# Patient Record
Sex: Female | Born: 1955 | Race: Asian | Hispanic: No | Marital: Married | Smoking: Never smoker
Health system: Southern US, Community
[De-identification: ages and names within clinical notes are randomized; demographics above are authoritative.]

## PROBLEM LIST (undated history)

## (undated) DIAGNOSIS — D571 Sickle-cell disease without crisis: Secondary | ICD-10-CM

## (undated) DIAGNOSIS — E119 Type 2 diabetes mellitus without complications: Secondary | ICD-10-CM

## (undated) DIAGNOSIS — R011 Cardiac murmur, unspecified: Secondary | ICD-10-CM

## (undated) DIAGNOSIS — K449 Diaphragmatic hernia without obstruction or gangrene: Secondary | ICD-10-CM

## (undated) DIAGNOSIS — K219 Gastro-esophageal reflux disease without esophagitis: Secondary | ICD-10-CM

## (undated) DIAGNOSIS — I509 Heart failure, unspecified: Secondary | ICD-10-CM

## (undated) DIAGNOSIS — R1310 Dysphagia, unspecified: Secondary | ICD-10-CM

## (undated) DIAGNOSIS — I10 Essential (primary) hypertension: Secondary | ICD-10-CM

## (undated) HISTORY — DX: Dysphagia, unspecified: R13.10

## (undated) HISTORY — DX: Gastro-esophageal reflux disease without esophagitis: K21.9

## (undated) HISTORY — DX: Cardiac murmur, unspecified: R01.1

## (undated) HISTORY — DX: Diaphragmatic hernia without obstruction or gangrene: K44.9

## (undated) HISTORY — DX: Sickle-cell disease without crisis: D57.1

## (undated) HISTORY — DX: Heart failure, unspecified: I50.9

## (undated) HISTORY — DX: Essential (primary) hypertension: I10

---

## 2014-03-27 ENCOUNTER — Ambulatory Visit (INDEPENDENT_AMBULATORY_CARE_PROVIDER_SITE_OTHER): Payer: Self-pay | Admitting: Family Medicine

## 2014-03-27 VITALS — BP 140/90 | HR 64 | Temp 97.5°F | Resp 16 | Ht 62.0 in | Wt 145.0 lb

## 2014-03-27 DIAGNOSIS — I10 Essential (primary) hypertension: Secondary | ICD-10-CM

## 2014-03-27 MED ORDER — LISINOPRIL-HYDROCHLOROTHIAZIDE 10-12.5 MG PO TABS: 1.0000 | ORAL_TABLET | Freq: Every day | ORAL | Status: DC

## 2014-03-27 NOTE — Patient Instructions (Signed)
T?ng huy?t p (Hypertension) T?ng huy?t p, th??ng ???c g?i l huy?t p cao, l khi l?c b?m mu qua ??ng m?ch c?a qu v? qu m?nh. ??ng m?ch c?a qu v? l cc m?ch mu mang mu t? tim ?i kh?p c? th? c?a qu v?. K?t qu? ?o huy?t p c m?t con s? cao v m?t con s? th?p, ch?ng h?n 110/72. Con s? cao (tm thu) l p l?c bn trong ??ng m?ch khi tim qu v? b?m. Con s? th?p (tm tr??ng) l p l?c bn trong ??ng m?ch khi tim qu v? gin ra. Huy?t p l t??ng c?n cho qu v? ph?i l d??i 120/80. Ch?ng t?ng huy?t p bu?c tim qu v? ph?i lm vi?c v?t v? h?n ?? b?m mu. ??ng m?ch c?a qu v? c th? b? h?p ho?c c?ng. Ch?ng t?ng huy?t p lm qu v? c nguy c? b? b?nh tim, ??t qu? v cc v?n ?? khc.  CC Y?U T? NGUY C? M?t s? y?u t? nguy c? d?n ??n huy?t p cao c th? ki?m sot ???c. M?t s? y?u t? khc th khng.  Nh?ng y?u t? nguy c? khng th? ki?m sot ???c bao g?m:   Ch?ng t?c. Qu v? c nguy c? cao h?n n?u qu v? l ng??i M? g?c Phi.  ?? tu?i. Nguy c? t?ng ln theo ?? tu?i.  Gi?i tnh. Nam gi?i c nguy c? cao h?n ph? n? tr??c tu?i 45. Sau tu?i 65, ph? n? c nguy c? cao h?n nam gi?i. Nh?ng y?u t? nguy c? c th? ki?m sot ???c bao g?m:  Khng t?p th? d?c ho?c cc ho?t ??ng th? ch?t ??y ??.  Th?a cn.  ?n qu nhi?u ch?t bo, ???ng, ca-lo, ho?c mu?i.  U?ng qu nhi?u r??u. D?U HI?U V TRI?U CH?NG T?ng huy?t p th??ng khng gy ra d?u hi?u ho?c tri?u ch?ng. Huy?t p r?t cao (c?n cao huy?t p) c th? gy ?au ??u, lo l?ng, kh th?, v ch?y mu cam. CH?N ?ON  ?? ki?m tra xem qu v? c t?ng huy?t p khng, chuyn gia ch?m sc s?c kh?e c?a qu v? s? ?o huy?t p trong khi qu v? ng?i ??t tay ? m?c ngang v?i tim. Huy?t p c?n ???c ?o t nh?t hai l?n trn cng m?t cnh tay. M?t s? tnh tr?ng nh?t ??nh c th? lm cho huy?t p khc nhau gi?a tay ph?i v tay tri c?a qu v?. K?t qu? ?o huy?t p cao h?n bnh th??ng ? m?t th?i ?i?m no ? khng c ngh?a l qu v? c?n ?i?u tr?. N?u k?t qu? ?o huy?t p cao, hy h?i chuyn  gia ch?m sc s?c kh?e v? vi?c ki?m tra l?i huy?t p. ?I?U TR?  ?i?u tr? huy?t p cao gao g?m thay ??i l?i s?ng v c th? ph?i dng thu?c. C m?t l?i s?ng lnh m?nh c th? gip lm gi?m huy?t p cao. Qu v? c th? c?n thay ??i m?t s? thi quen. Thay ??i l?i s?ng c th? bao g?m:  Th?c hi?n ch? ?? ?n DASH. Ch? ?? ?n ny c nhi?u tri cy, rau, v ng? c?c nguyn h?t. C t mu?i, th?t ??, v t b? sung ???ng.  Hy dnh 2 1/2 ti?ng ho?t ??ng thn th? nhanh m?i tu?n.  Gi?m cn n?u c?n thi?t.  Khng ht thu?c.  H?n ch? ?? u?ng c c?n.  H?c cc cch gi?m c?ng th?ng. N?u thay ??i l?i s?ng khng ?? ?? ??a huy?t p v? m?c c th? ki?m sot ???  c, chuyn gia ch?m sc s?c kh?e c th? k ??n thu?c. Qu v? c th? c?n dng nhi?u lo?i thu?c. Ph?i h?p ch?t ch? v?i chuyn gia ch?m sc s?c kh?e ?? tm hi?u cc nguy c? v l?i ch. H??NG D?N CH?M SC T?I NH  Ki?m tra l?i huy?t p c?a qu v? theo ch? d?n c?a chuyn gia ch?m sc s?c kh?e.  Ch? s? d?ng thu?c theo ch? d?n c?a chuyn gia ch?m sc s?c kh?e. Lm theo ch? d?n m?t cch c?n th?n. Thu?c ?i?u tr? huy?t p ph?i ???c dng theo ??n ? k. Thu?c c?ng s? khng c tc d?ng khi qu v? b? li?u. Vi?c b? li?u thu?c c?ng lm qu v? c nguy c? pht sinh v?n ??.  Khng ht thu?c.  Theo di huy?t p c?a qu v? ? nh theo ch? d?n c?a chuyn gia ch?m sc s?c kh?e. ?I KHM N?U:   Qu v? ngh? qu v? c ph?n ?ng v?i thu?c ?ang dng.  Qu v? b? ?au ??u ho?c c?m th?y chng m?t ti di?n.  Qu v? b? s?ng ph ? m?t c chn.  Qu v? c v?n ?? v? th? l?c. NGAY L?P T?C ?I KHM N?U:  Qu v? b? ?au ??u n?ng ho?c l l?n.  Qu v? b? y?u b?t th??ng, t b, ho?c c?m th?y nh? ng?t x?u.  Qu v? b? ?au ng?c ho?c ?au b?ng r?t nhi?u.  Qu v? nn nhi?u l?n.  Qu v? b? kh th?. ??M B?O QU V?:   Hi?u r cc h??ng d?n ny.  S? theo di tnh tr?ng c?a mnh.  S? yu c?u tr? gip ngay l?p t?c n?u qu v? c?m th?y khng kh?e ho?c th?y tr?m tr?ng h?n. Document Released:  07/31/2005 Document Revised: 12/15/2013 ExitCare Patient Information 2015 [employer]. This information is not intended to replace advice given to you by your health care provider. Make sure you discuss any questions you have with your health care provider.  

## 2014-03-27 NOTE — Progress Notes (Signed)
Chief Complaint:  Chief Complaint  Patient presents with  . Hypertension    HPI: Madeline Bell is a 58 y.o. female who is here for  HTN check up Was at the dentist 196/103 on Wednesday, on Thursday 153/100 , then this morning 176/90 She was on BP meds twice daily but not sure the name, she was given it in [place] and only 1 month.  She never return. No alleries with blood pressure medicines. The blood pressure made her drowsy, HA and then it went away Denies any CP, SOB, n/v/abd pain, palpitations She had a check up 1 year ago and then took   Past Medical History  Diagnosis Date  . Hypertension    History reviewed. No pertinent past surgical history. History   Social History  . Marital Status: Married    Spouse Name: N/A    Number of Children: N/A  . Years of Education: N/A   Social History Main Topics  . Smoking status: Never Smoker   . Smokeless tobacco: None  . Alcohol Use: None  . Drug Use: None  . Sexual Activity: None   Other Topics Concern  . None   Social History Narrative  . None   Family History  Problem Relation Age of Onset  . Hypertension Father    Allergies  Allergen Reactions  . Bactrim [Sulfamethoxazole-Tmp Ds]     Rash   . Cephalexin   . Doxycycline     Rash and itching and lips feel burnt, no anaphylaxis  . Penicillins   . Tetracyclines & Related Rash   Prior to Admission medications   Medication Sig Start Date End Date Taking? Authorizing Provider  ibuprofen (ADVIL,MOTRIN) 200 MG tablet Take 200 mg by mouth every 6 (six) hours as needed.   Yes Historical Provider, MD     ROS: The patient denies fevers, chills, night sweats, unintentional weight loss, chest pain, palpitations, wheezing, dyspnea on exertion, nausea, vomiting, abdominal pain, dysuria, hematuria, melena, numbness, weakness, or tingling.   All other systems have been reviewed and were otherwise negative with the exception of those mentioned in the HPI and as above.     PHYSICAL EXAM: Filed Vitals:   03/27/14 1311  BP: 140/90  Pulse: 64  Temp: 97.5 F (36.4 C)  Resp: 16   Filed Vitals:   03/27/14 1311  Height: 5\' 2"  (1.575 m)  Weight: 145 lb (65.772 kg)   Body mass index is 26.51 kg/(m^2).  General: Alert, no acute distress HEENT:  Normocephalic, atraumatic, oropharynx patent. EOMI, PERRLA, fundo exam normal Cardiovascular:  Regular rate and rhythm, no rubs murmurs or gallops.  No Carotid bruits, radial pulse intact. No pedal edema.  Respiratory: Clear to auscultation bilaterally.  No wheezes, rales, or rhonchi.  No cyanosis, no use of accessory musculature GI: No organomegaly, abdomen is soft and non-tender, positive bowel sounds.  No masses. Skin: No rashes. Neurologic: Facial musculature symmetric. CN 2-12 grossly nl Psychiatric: Patient is appropriate throughout our interaction. Lymphatic: No cervical lymphadenopathy Musculoskeletal: Gait intact. 5/5 strength   LABS: No results found for this or any previous visit.   EKG/XRAY:   Primary read interpreted by Dr. Conley Rolls at [facility].   ASSESSMENT/PLAN: Encounter Diagnosis  Name Primary?  . Essential hypertension Yes   Monitor BP, BP goal is < 150/90 Rx lisnopril hctz F/u in 2-4 weeks at appt office with BP logs, advise to get BP log Advise to follow dash diet, cut down on soy sauce and  salt  Gross sideeffects, risk and benefits, and alternatives of medications d/w patient. Patient is aware that all medications have potential sideeffects and we are unable to predict every sideeffect or drug-drug interaction that may occur.  [name], [name], DO 03/27/2014 3:26 PM

## 2014-03-28 LAB — COMPLETE METABOLIC PANEL WITHOUT GFR
ALT: 15 U/L (ref 0–35)
AST: 20 U/L (ref 0–37)
BUN: 9 mg/dL (ref 6–23)
Creat: 0.53 mg/dL (ref 0.50–1.10)
GFR, Est African American: >89 mL/min
Total Bilirubin: 0.7 mg/dL (ref 0.2–1.2)

## 2014-03-28 LAB — COMPLETE METABOLIC PANEL WITH GFR
Albumin: 5 g/dL (ref 3.5–5.2)
Alkaline Phosphatase: 74 U/L (ref 39–117)
CO2: 22 mEq/L (ref 19–32)
Calcium: 9.7 mg/dL (ref 8.4–10.5)
Chloride: 101 mEq/L (ref 96–112)
GFR, Est Non African American: >89 mL/min
Glucose, Bld: 74 mg/dL (ref 70–99)
Potassium: 5 mEq/L (ref 3.5–5.3)
Sodium: 139 mEq/L (ref 135–145)
Total Protein: 8.6 g/dL — ABNORMAL HIGH (ref 6.0–8.3)

## 2014-04-09 ENCOUNTER — Encounter: Payer: Self-pay | Admitting: Family Medicine

## 2014-05-02 ENCOUNTER — Ambulatory Visit (INDEPENDENT_AMBULATORY_CARE_PROVIDER_SITE_OTHER): Payer: Self-pay | Admitting: Family Medicine

## 2014-05-02 VITALS — BP 130/78 | HR 68 | Temp 97.9°F | Resp 16 | Ht 61.0 in | Wt 138.0 lb

## 2014-05-02 DIAGNOSIS — L299 Pruritus, unspecified: Secondary | ICD-10-CM

## 2014-05-02 DIAGNOSIS — L270 Generalized skin eruption due to drugs and medicaments taken internally: Secondary | ICD-10-CM

## 2014-05-02 DIAGNOSIS — T50905A Adverse effect of unspecified drugs, medicaments and biological substances, initial encounter: Secondary | ICD-10-CM

## 2014-05-02 DIAGNOSIS — L298 Other pruritus: Secondary | ICD-10-CM

## 2014-05-02 DIAGNOSIS — I10 Essential (primary) hypertension: Secondary | ICD-10-CM

## 2014-05-02 MED ORDER — METHYLPREDNISOLONE (PAK) 4 MG PO TABS: ORAL_TABLET | ORAL | Status: DC

## 2014-05-02 MED ORDER — METHYLPREDNISOLONE ACETATE 80 MG/ML IJ SUSP
120.0000 mg | Freq: Once | INTRAMUSCULAR | Status: AC
  Administered 2014-05-02: 120 mg via INTRAMUSCULAR

## 2014-05-02 MED ORDER — AMLODIPINE BESYLATE 5 MG PO TABS: 5.0000 mg | ORAL_TABLET | Freq: Every day | ORAL | Status: DC

## 2014-05-02 NOTE — Progress Notes (Signed)
Subjective: 58 year old [place] lady who does not speak much [place]. Her sons came in to interpret for her today. She was here last month and saw Dr. Conley Rolls. She was treated with a blood pressure medicine. A week ago she started breaking out with a pruritic rash. It got worse after she resumed taking the medicine. She itches terribly. She had some itching her face and lips. Chest wall and arms. Some minor ankles and upper back.  Objective: Has a confluent red rash on her arms and down onto her hands. The hands are more maculopapular. She has a little rash on her face and upper back and chest wall and ankles. Chest was clear. Heart regular. She did start coughing a little cause in the room. That mammogram not [place] related to the allergy.  Assessment: Probable hydrochlorothiazide allergic reaction from the lisinopril HCT, though it could Allaina lisinopril also History of multiple medication allergies including sulfa  Hypertension   Plan: Depo-Medrol 120 Prednisone Ranitidine Cautioned to go to emergency room if worse Zyrtec  Will go to amlodipine. She has used it in the past.

## 2014-05-02 NOTE — Patient Instructions (Addendum)
Take over-the-counter Zyrtec (cetirizine) 1 daily for itching and allergy  Take over-the-counter ranitidine 150 mg twice daily for rash. This is a stomach medicine, but it also helps the rash.  Take the Medrol Dosepak in the fashion directed on the pack  Return if worse or not improving  Begin taking amlodipine 5 mg one daily  Return in 3 months to get your blood pressure rechecked

## 2014-06-20 ENCOUNTER — Other Ambulatory Visit: Payer: Self-pay | Admitting: Family Medicine

## 2016-02-17 ENCOUNTER — Ambulatory Visit (INDEPENDENT_AMBULATORY_CARE_PROVIDER_SITE_OTHER): Payer: Self-pay

## 2016-02-17 ENCOUNTER — Ambulatory Visit (INDEPENDENT_AMBULATORY_CARE_PROVIDER_SITE_OTHER): Payer: Self-pay | Admitting: Physician Assistant

## 2016-02-17 VITALS — BP 140/92 | HR 94 | Temp 97.9°F | Resp 16 | Ht 61.0 in | Wt 155.0 lb

## 2016-02-17 DIAGNOSIS — R229 Localized swelling, mass and lump, unspecified: Secondary | ICD-10-CM

## 2016-02-17 DIAGNOSIS — M79671 Pain in right foot: Secondary | ICD-10-CM

## 2016-02-17 LAB — POCT CBC
GRANULOCYTE PERCENT: 59 % (ref 37–80)
HCT, POC: 40.2 % (ref 37.7–47.9)
Hemoglobin: 13.6 g/dL (ref 12.2–16.2)
LYMPH, POC: 4.3 — AB (ref 0.6–3.4)
MCH, POC: 26.6 pg — AB (ref 27–31.2)
MCHC: 33.8 g/dL (ref 31.8–35.4)
MCV: 78.6 fL — AB (ref 80–97)
MID (CBC): 0.4 (ref 0–0.9)
MPV: 7.3 fL (ref 0–99.8)
PLATELET COUNT, POC: 225 10*3/uL (ref 142–424)
POC Granulocyte: 6.7 (ref 2–6.9)
POC LYMPH PERCENT: 37.5 %L (ref 10–50)
POC MID %: 3.5 %M (ref 0–12)
RBC: 5.12 M/uL (ref 4.04–5.48)
RDW, POC: 13.3 %
WBC: 11.4 10*3/uL — AB (ref 4.6–10.2)

## 2016-02-17 MED ORDER — MELOXICAM 7.5 MG PO TABS: 7.5000 mg | ORAL_TABLET | Freq: Every day | ORAL | Status: DC

## 2016-02-17 NOTE — Progress Notes (Signed)
[facility] [address], [place] [id number] [phone]  Date:  02/17/2016   Name:  Madeline Bell   DOB:  03/16/1956   MRN:  [id number]  PCP:  No PCP Per Patient    History of Present Illness:  Madeline Bell is a 60 y.o. female patient who [facility] cc of left heel pain.   3 weeks pain with right foot.  She has swelling around the ankle.  Heel pain radiates up her heel.  She has never injureed the foot.  No redness.  She generally wear soft shoes.  Nothing makes it feel better.  Stays home without working.  Does not recall any extreme long walks.  She has not had any broken materials in the home that she can recall, that she has walked on.  No trauma.     Patient Active Problem List   Diagnosis Date Noted  . HTN (hypertension), benign 05/02/2014    Past Medical History  Diagnosis Date  . Hypertension     History reviewed. No pertinent past surgical history.  Social History  Substance Use Topics  . Smoking status: Never Smoker   . Smokeless tobacco: None  . Alcohol Use: None    Family History  Problem Relation Age of Onset  . Hypertension Father     Allergies  Allergen Reactions  . Lisinopril-Hydrochlorothiazide   . Bactrim [Sulfamethoxazole-Trimethoprim]     Rash   . Cephalexin   . Doxycycline     Rash and itching and lips feel burnt, no anaphylaxis  . Gentamicin Sulfate   . Penicillins   . Tetracyclines & Related Rash    Medication list has been reviewed and updated.  Current Outpatient Prescriptions on File Prior to Visit  Medication Sig Dispense Refill  . amLODipine (NORVASC) 5 MG tablet Take 1 tablet (5 mg total) by mouth daily. (Patient not taking: Reported on 02/17/2016) 90 tablet 1  . ibuprofen (ADVIL,MOTRIN) 200 MG tablet Take 200 mg by mouth every 6 (six) hours as needed. Reported on 02/17/2016     No current facility-administered medications on file prior to visit.    ROS ROS otherwise unremarkable unless listed above.     Physical Examination: BP 174/97 mmHg  Pulse 94  Temp(Src) 97.9 F (36.6 C) (Oral)  Resp 16  Ht 5\' 1"  (1.549 m)  Wt 155 lb (70.308 kg)  BMI 29.30 kg/m2  SpO2 97% Ideal Body Weight: Weight in (lb) to have BMI = 25: 132  Physical Exam  Constitutional: She is oriented to person, place, and time. She appears well-developed and well-nourished. No distress.  HENT:  Head: Normocephalic and atraumatic.  Right Ear: External ear normal.  Left Ear: External ear normal.  Eyes: Conjunctivae and EOM are normal. Pupils are equal, round, and reactive to light.  Cardiovascular: Normal rate.   Pulmonary/Chest: Effort normal. No respiratory distress.  Musculoskeletal:  Right foot with pain at the heel upon palpation.  No erythema or obvious swelling, or warmth detected.  No foreign body or callused location detected.  Normal range of motion.  Negative thompson.  Slight antalgic gait.       Neurological: She is alert and oriented to person, place, and time.  Skin: She is not diaphoretic.  Psychiatric: She has a normal mood and affect. Her behavior is normal.    Dg Os Calcis Right  02/17/2016  CLINICAL DATA:  Left heel pain.  Swelling.  No known injury. EXAM: RIGHT OS CALCIS -  2+ VIEW COMPARISON:  No prior. FINDINGS: Diffuse soft tissue swelling. No radiopaque foreign body. No focal or acute bony abnormality . IMPRESSION: Diffuse soft tissue swelling. No radiopaque foreign body. No acute bony abnormality. Electronically Signed   By: Maisie Fushomas  Register   On: 02/17/2016 09:20    Assessment and Plan: Madeline Bell is a 60 y.o. female who is here today for right heel pain. Language barrier while she is with daughter who is very proficient.   Likely inflammation possible poor footwear as she arrives here in flip flops.   Advised ice and anti-inflammatory use.  Also advised sneaker footwear.  rtc in 10 days for followup.  Advised alarming sxs to warrant immediate return. Heel pain, right - Plan: DG Os Calcis  Right, POCT CBC, meloxicam (MOBIC) 7.5 MG tablet  Soft tissue swelling - Plan: meloxicam (MOBIC) 7.5 MG tablet  Trena PlattStephanie Ashira Kirsten, PA-C [facility] 02/17/2016 8:47 AM

## 2016-02-17 NOTE — Patient Instructions (Addendum)
     IF you received an x-ray today, you will receive an invoice from [facility]. Please contact [facility] at [phone] with questions or concerns regarding your invoice.   IF you received labwork today, you will receive an invoice from [facility]. Please contact [facility] at [phone] with questions or concerns regarding your invoice.   Our billing staff will not Vi able to assist you with questions regarding bills from these companies.  You will Antha contacted with the lab results as soon as they are available. The fastest way to get your results is to activate your My Chart account. Instructions are located on the last page of this paperwork. If you have not heard from us regarding the results in 2 weeks, please contact this office.    Ice the heel three times per day for 15 minutes.  This could Shacoya submerged in ice tub I would like you to also wear heel cups in shoes.  These can Jolisa picked up at the pharmacy or [employer].  You can ask the pharmacy to point you to this. Please take the mobic as prescribed.  Do not take naproxen or ibuprofen with this.  You can take tylenol.

## 2016-03-15 ENCOUNTER — Other Ambulatory Visit: Payer: Self-pay | Admitting: Physician Assistant

## 2016-03-15 DIAGNOSIS — M79671 Pain in right foot: Secondary | ICD-10-CM

## 2016-03-15 DIAGNOSIS — R229 Localized swelling, mass and lump, unspecified: Secondary | ICD-10-CM

## 2016-07-21 ENCOUNTER — Ambulatory Visit (INDEPENDENT_AMBULATORY_CARE_PROVIDER_SITE_OTHER): Payer: Self-pay | Admitting: Family Medicine

## 2016-07-21 VITALS — BP 124/78 | HR 84 | Temp 98.4°F | Resp 16 | Ht 62.5 in | Wt 153.0 lb

## 2016-07-21 DIAGNOSIS — J069 Acute upper respiratory infection, unspecified: Secondary | ICD-10-CM

## 2016-07-21 DIAGNOSIS — R630 Anorexia: Secondary | ICD-10-CM

## 2016-07-21 DIAGNOSIS — R1013 Epigastric pain: Secondary | ICD-10-CM

## 2016-07-21 LAB — POCT URINALYSIS DIP (MANUAL ENTRY)
GLUCOSE UA: NEGATIVE
Ketones, POC UA: NEGATIVE
NITRITE UA: NEGATIVE
Protein Ur, POC: 30 — AB
RBC UA: NEGATIVE
Spec Grav, UA: 1.02
UROBILINOGEN UA: 0.2
pH, UA: 5.5

## 2016-07-21 MED ORDER — OMEPRAZOLE 20 MG PO CPDR: 20.0000 mg | DELAYED_RELEASE_CAPSULE | Freq: Every day | ORAL | 3 refills | Status: DC

## 2016-07-21 MED ORDER — BENZONATATE 100 MG PO CAPS: 100.0000 mg | ORAL_CAPSULE | Freq: Two times a day (BID) | ORAL | 0 refills | Status: DC | PRN

## 2016-07-21 MED ORDER — FAMOTIDINE 20 MG PO TABS
20.0000 mg | ORAL_TABLET | Freq: Two times a day (BID) | ORAL | 1 refills | Status: DC
Start: 2016-07-21 — End: 2018-02-23

## 2016-07-21 MED ORDER — CETIRIZINE HCL 10 MG PO TABS
10.0000 mg | ORAL_TABLET | Freq: Every day | ORAL | 11 refills | Status: DC
Start: 2016-07-21 — End: 2018-02-23

## 2016-07-21 MED ORDER — LORATADINE 10 MG PO TABS: 10.0000 mg | ORAL_TABLET | Freq: Every day | ORAL | 3 refills | Status: DC

## 2016-07-21 NOTE — Progress Notes (Signed)
Chief Complaint  Patient presents with  . Cough    last few days  . Chills  . Sore Throat    HPI  Pt is here with her daughter who is translating Djin Ellenbecker  She reports that she has bitter saliva and a cough that is causing abdominal pain. Symptoms started 4 days ago and is getting better She also has chills but no fevers She reports that when she coughs her abdomen feels tight and painful She denies diarrhea but reports nausea with eating She has not been eating well with poor appetite She reports that there is not sick contacts   Past Medical History:  Diagnosis Date  . Hypertension     Current Outpatient Prescriptions  Medication Sig Dispense Refill  . amLODipine (NORVASC) 5 MG tablet Take 1 tablet (5 mg total) by mouth daily. (Patient not taking: Reported on 07/21/2016) 90 tablet 1  . ibuprofen (ADVIL,MOTRIN) 200 MG tablet Take 200 mg by mouth every 6 (six) hours as needed. Reported on 02/17/2016    . loratadine (CLARITIN) 10 MG tablet Take 1 tablet (10 mg total) by mouth daily. 30 tablet 3  . meloxicam (MOBIC) 7.5 MG tablet Take 1 tablet (7.5 mg total) by mouth daily. (Patient not taking: Reported on 07/21/2016) 30 tablet 0  . omeprazole (PRILOSEC) 20 MG capsule Take 1 capsule (20 mg total) by mouth daily. 30 capsule 3   No current facility-administered medications for this visit.     Allergies:  Allergies  Allergen Reactions  . Lisinopril-Hydrochlorothiazide   . Bactrim [Sulfamethoxazole-Trimethoprim]     Rash   . Cephalexin   . Doxycycline     Rash and itching and lips feel burnt, no anaphylaxis  . Gentamicin Sulfate   . Penicillins   . Tetracyclines & Related Rash    No past surgical history on file.  Social History   Social History  . Marital status: Married    Spouse name: N/A  . Number of children: N/A  . Years of education: N/A   Social History Main Topics  . Smoking status: Never Smoker  . Smokeless tobacco: None  . Alcohol use None  . Drug  use: Unknown  . Sexual activity: Not Asked   Other Topics Concern  . None   Social History Narrative  . None    Review of Systems  Constitutional: Positive for chills and malaise/fatigue. Negative for fever.  Respiratory: Positive for cough. Negative for hemoptysis, sputum production, shortness of breath and wheezing.   Cardiovascular: Negative for chest pain and palpitations.  Gastrointestinal: Positive for abdominal pain and nausea. Negative for diarrhea, heartburn and vomiting.  Genitourinary: Negative for dysuria, frequency and urgency.  Skin: Negative for itching and rash.    Objective: Vitals:   07/21/16 0952  BP: 124/78  Pulse: 84  Resp: 16  Temp: 98.4 F (36.9 C)  SpO2: 95%  Weight: 153 lb (69.4 kg)  Height: 5' 2.5" (1.588 m)    Physical Exam  General: alert, oriented, in NAD Head: normocephalic, atraumatic, no sinus tenderness Eyes: EOM intact, no scleral icterus or conjunctival injection Ears: TM clear bilaterally Throat: no pharyngeal exudate or erythema Lymph: no posterior auricular, submental or cervical lymph adenopathy Heart: normal rate, normal sinus rhythm, no murmurs Lungs: clear to auscultation bilaterally, no wheezing Abdomen: nondistended, normoactive bs, soft, epigastric tenderness   POCT urinalysis dipstick  Order: [id number]  Status:  Final result Visible to patient:  No (Not Released) Next appt:  None Dx:  Epigastric  pain; Poor appetite   Ref Range & Units 10:29  Color, UA yellow yellow   Clarity, UA clear clear   Glucose, UA negative negative   Bilirubin, UA negative small    Ketones, POC UA negative negative   Spec Grav, UA  1.020   Blood, UA negative negative   pH, UA  5.5   Protein Ur, POC negative =30    Urobilinogen, UA  0.2   Nitrite, UA Negative Negative   Leukocytes, UA Negative Trace          Assessment and Plan Myna was seen today for cough, chills and sore throat.  Diagnoses and all orders for this  visit:  Epigastric pain- discussed reflux  She will Jaylei given pepcid bid to allow the irritation of the lining to heal -     POCT urinalysis dipstick  Poor appetite- advised pt to to increase intake of fluids and food Gave medication to minimize mucus drainage (zyrtec) Increase hydration to keep urine clear If urine output does not increase then return to clinic for reevaluation -     POCT urinalysis dipstick  Acute URI- tessalon, claritin, and supportive care  Other orders -     omeprazole (PRILOSEC) 20 MG capsule; Take 1 capsule (20 mg total) by mouth daily. -     loratadine (CLARITIN) 10 MG tablet; Take 1 tablet (10 mg total) by mouth daily.     Alishia Lebo A Lashai Grosch

## 2016-07-21 NOTE — Patient Instructions (Addendum)
IF you received an x-ray today, you will receive an invoice from [facility]. Please contact [facility] at [phone] with questions or concerns regarding your invoice.   IF you received labwork today, you will receive an invoice from [facility]. Please contact [facility] at [phone] with questions or concerns regarding your invoice.   Our billing staff will not Lesleyanne able to assist you with questions regarding bills from these companies.  You will Ryelle contacted with the lab results as soon as they are available. The fastest way to get your results is to activate your My Chart account. Instructions are located on the last page of this paperwork. If you have not heard from us regarding the results in 2 weeks, please contact this office.      Heartburn Heartburn is a type of pain or discomfort that can happen in the throat or chest. It is often described as a burning pain. It may also cause a bad taste in the mouth. Heartburn may feel worse when you lie down or bend over, and it is often worse at night. Heartburn may Ajai caused by stomach contents that move back up into the esophagus (reflux). Follow these instructions at home: Take these actions to decrease your discomfort and to help avoid complications. Diet  Follow a diet as recommended by your health care provider. This may involve avoiding foods and drinks such as:  Coffee and tea (with or without caffeine).  Drinks that contain alcohol.  Energy drinks and sports drinks.  Carbonated drinks or sodas.  Chocolate and cocoa.  Peppermint and mint flavorings.  Garlic and onions.  Horseradish.  Spicy and acidic foods, including peppers, chili powder, curry powder, vinegar, hot sauces, and barbecue sauce.  Citrus fruit juices and citrus fruits, such as oranges, lemons, and limes.  Tomato-based foods, such as red sauce, chili, salsa, and pizza with red sauce.  Fried and fatty  foods, such as donuts, french fries, potato chips, and high-fat dressings.  High-fat meats, such as hot dogs and fatty cuts of red and white meats, such as rib eye steak, sausage, ham, and bacon.  High-fat dairy items, such as whole milk, butter, and cream cheese.  Eat small, frequent meals instead of large meals.  Avoid drinking large amounts of liquid with your meals.  Avoid eating meals during the 2-3 hours before bedtime.  Avoid lying down right after you eat.  Do not exercise right after you eat. General instructions  Pay attention to any changes in your symptoms.  Take over-the-counter and prescription medicines only as told by your health care provider. Do not take aspirin, ibuprofen, or other NSAIDs unless your health care provider told you to do so.  Do not use any tobacco products, including cigarettes, chewing tobacco, and e-cigarettes. If you need help quitting, ask your health care provider.  Wear loose-fitting clothing. Do not wear anything tight around your waist that causes pressure on your abdomen.  Raise (elevate) the head of your bed about 6 inches (15 cm).  Try to reduce your stress, such as with yoga or meditation. If you need help reducing stress, ask your health care provider.  If you are overweight, reduce your weight to an amount that is healthy for you. Ask your health care provider for guidance about a safe weight loss goal.  Keep all follow-up visits as told by your health care provider. This is important. Contact a health care provider if:  You have new symptoms.  You have unexplained weight loss.  You have difficulty swallowing, or it hurts to swallow.  You have wheezing or a persistent cough.  Your symptoms do not improve with treatment.  You have frequent heartburn for more than two weeks. Get help right away if:  You have pain in your arms, neck, jaw, teeth, or back.  You feel sweaty, dizzy, or light-headed.  You have chest pain or  shortness of breath.  You vomit and your vomit looks like blood or coffee grounds.  Your stool is bloody or black. This information is not intended to replace advice given to you by your health care provider. Make sure you discuss any questions you have with your health care provider. Document Released: 12/17/2008 Document Revised: 01/06/2016 Document Reviewed: 11/25/2014 Elsevier Interactive Patient Education  2017 [employer]

## 2017-10-19 ENCOUNTER — Emergency Department (HOSPITAL_COMMUNITY): Payer: Self-pay

## 2017-10-19 ENCOUNTER — Encounter (HOSPITAL_COMMUNITY): Payer: Self-pay | Admitting: Emergency Medicine

## 2017-10-19 ENCOUNTER — Emergency Department (HOSPITAL_COMMUNITY)
Admission: EM | Admit: 2017-10-19 | Discharge: 2017-10-19 | Disposition: A | Discharge status: Home / Self Care | Payer: Self-pay | Attending: Emergency Medicine | Admitting: Emergency Medicine

## 2017-10-19 DIAGNOSIS — R519 Headache, unspecified: Secondary | ICD-10-CM

## 2017-10-19 DIAGNOSIS — R51 Headache: Secondary | ICD-10-CM | POA: Insufficient documentation

## 2017-10-19 DIAGNOSIS — Z79899 Other long term (current) drug therapy: Secondary | ICD-10-CM | POA: Insufficient documentation

## 2017-10-19 LAB — I-STAT TROPONIN, ED: Troponin i, poc: 0 ng/mL (ref 0.00–0.08)

## 2017-10-19 LAB — BASIC METABOLIC PANEL
ANION GAP: 14 (ref 5–15)
BUN: 12 mg/dL (ref 6–20)
CHLORIDE: 103 mmol/L (ref 101–111)
CO2: 22 mmol/L (ref 22–32)
Calcium: 9.5 mg/dL (ref 8.9–10.3)
Creatinine, Ser: 0.73 mg/dL (ref 0.44–1.00)
GFR calc Af Amer: >60 mL/min (ref >60)
GLUCOSE: 169 mg/dL — AB (ref 65–99)
POTASSIUM: 4.2 mmol/L (ref 3.5–5.1)
Sodium: 139 mmol/L (ref 135–145)

## 2017-10-19 LAB — CBC
HEMATOCRIT: 40.8 % (ref 36.0–46.0)
HEMOGLOBIN: 13.3 g/dL (ref 12.0–15.0)
MCH: 26.4 pg (ref 26.0–34.0)
MCHC: 32.6 g/dL (ref 30.0–36.0)
MCV: 81 fL (ref 78.0–100.0)
Platelets: 338 10*3/uL (ref 150–400)
RBC: 5.04 MIL/uL (ref 3.87–5.11)
RDW: 13.1 % (ref 11.5–15.5)
WBC: 10.6 10*3/uL — ABNORMAL HIGH (ref 4.0–10.5)

## 2017-10-19 MED ORDER — BUTALBITAL-APAP-CAFFEINE 50-325-40 MG PO TABS: 1.0000 | ORAL_TABLET | Freq: Four times a day (QID) | ORAL | 0 refills | Status: DC | PRN

## 2017-10-19 NOTE — ED Triage Notes (Signed)
Pt to ER sent from PCP for evaluation of hypertension and abnormal EKG. Was seen at clinic today for left facial and neck pain. BP noted to Taleeyah 202/100, was given clonidine. BP at this time 140/82. A/o x4. NAD

## 2017-10-19 NOTE — ED Notes (Addendum)
Pt present from her doctor's office with a complaint of left sided head pain behind her eyes and left ear pain that radiates between both sites, HTN, and an abnormal EKG. Pt denies any chest pains. Pt does not speak english but her daughter can translate.

## 2017-10-19 NOTE — Discharge Instructions (Signed)
You have been evaluated for your headache today.  Your head CT scan is normal.  Your blood pressure have improved.  Follow up with your doctor for further evaluation and management of your high blood pressure.  Return if you have any concerns.

## 2017-10-19 NOTE — ED Provider Notes (Signed)
[facility] EMERGENCY DEPARTMENT Provider Note   CSN: [id number] Arrival date & time: 10/19/17  1546     History   Chief Complaint Chief Complaint  Patient presents with  . Hypertension  . Abnormal ECG    HPI Madeline Bell is a 62 y.o. female.  HPI   62 year old female with history of hypertension, non-smoker sent here from PCP office for evaluation of high blood pressure and abnormal EKG.  History obtained through daughter who is at bedside.  Patient report for the past 3 days she has had intermittent sharp shooting pain primarily involving the left side of her face radiates to her left ear.  Pain is usually lasting for several minutes.  The pain is minimal at this time.  She did endorse one bouts of slight blurry vision but that has since resolved.  She was seen by her PCP for the first time today for her complaint.  It was noted that her blood pressure was high at 202/105.  Patient was given clonidine.  EKG obtained shown some abnormalities and PCP sent patient here for further evaluation.  Patient mentioned that her headache is minimal at this time and did improve with blood pressure medication.  She denies any associated fever, chills, URI symptoms, diplopia, confusion, neck pain, chest pain, trouble breathing, focal numbness or weakness, abdominal pain or back pain.  She was diagnosed with high blood pressure in the past, was placed on lisinopril but developed a reaction to it therefore she had not been taking any blood pressure medication for a while.  Daughter also mentioned that patient loves to eat salty food.  Her daughter also concerned of a pulsating area on the right side of her neck which has been ongoing for years and not new.  Patient is not a smoker or drinker, no prior cardiac history aside from hypertension, no history of stroke.     Past Medical History:  Diagnosis Date  . Hypertension     Patient Active Problem List   Diagnosis Date Noted  . HTN  (hypertension), benign 05/02/2014    History reviewed. No pertinent surgical history.  OB History    No data available       Home Medications    Prior to Admission medications   Medication Sig Start Date End Date Taking? Authorizing Provider  amLODipine (NORVASC) 5 MG tablet Take 1 tablet (5 mg total) by mouth daily. Patient not taking: Reported on 07/21/2016 05/02/14   Peyton Najjar, MD  benzonatate (TESSALON) 100 MG capsule Take 1 capsule (100 mg total) by mouth 2 (two) times daily as needed for cough. 07/21/16   Doristine Bosworth, MD  cetirizine (ZYRTEC) 10 MG tablet Take 1 tablet (10 mg total) by mouth daily. 07/21/16   Doristine Bosworth, MD  famotidine (PEPCID) 20 MG tablet Take 1 tablet (20 mg total) by mouth 2 (two) times daily. 07/21/16   Doristine Bosworth, MD  ibuprofen (ADVIL,MOTRIN) 200 MG tablet Take 200 mg by mouth every 6 (six) hours as needed. Reported on 02/17/2016    [provider]  loratadine (CLARITIN) 10 MG tablet Take 1 tablet (10 mg total) by mouth daily. 07/21/16   Doristine Bosworth, MD  meloxicam (MOBIC) 7.5 MG tablet Take 1 tablet (7.5 mg total) by mouth daily. Patient not taking: Reported on 07/21/2016 02/17/16   Trena Platt D, PA  omeprazole (PRILOSEC) 20 MG capsule Take 1 capsule (20 mg total) by mouth daily. 07/21/16   Creta Levin,  Manus RuddZoe A, MD    Family History Family History  Problem Relation Age of Onset  . Hypertension Father     Social History Social History   Tobacco Use  . Smoking status: Never Smoker  . Smokeless tobacco: Never Used  Substance Use Topics  . Alcohol use: Not on file  . Drug use: Not on file     Allergies   Lisinopril-hydrochlorothiazide; Bactrim [sulfamethoxazole-trimethoprim]; Cephalexin; Doxycycline; Gentamicin sulfate; Penicillins; and Tetracyclines & related   Review of Systems Review of Systems  All other systems reviewed and are negative.    Physical Exam Updated Vital Signs BP 130/81   Pulse 74   Temp  98.9 F (37.2 C) (Oral)   Resp 17   SpO2 94%   Physical Exam  Constitutional: She is oriented to person, place, and time. She appears well-developed and well-nourished. No distress.  HENT:  Head: Atraumatic.  Ears: TMs normal bilaterally Nose: Normal nares Throat: Uvula midline normal rise of soft palate, no trismus  No temporal bruit  Eyes: Conjunctivae are normal.  Neck: Neck supple. No thyromegaly present.  Right carotid bruit, nontender to palpation  Cardiovascular: Normal rate and regular rhythm.  Pulmonary/Chest: Effort normal and breath sounds normal. No respiratory distress. She has no wheezes. She has no rales. She exhibits no tenderness.  Abdominal: Soft. She exhibits no distension. There is no tenderness.  Neurological: She is alert and oriented to person, place, and time. She has normal strength. No cranial nerve deficit or sensory deficit. She exhibits normal muscle tone. GCS eye subscore is 4. GCS verbal subscore is 5. GCS motor subscore is 6.  Skin: No rash noted.  Psychiatric: She has a normal mood and affect.  Nursing note and vitals reviewed.    ED Treatments / Results  Labs (all labs ordered are listed, but only abnormal results are displayed) Labs Reviewed  BASIC METABOLIC PANEL - Abnormal; Notable for the following components:      Result Value   Glucose, Bld 169 (*)    All other components within normal limits  CBC - Abnormal; Notable for the following components:   WBC 10.6 (*)    All other components within normal limits  I-STAT TROPONIN, ED    EKG  EKG Interpretation  Date/Time:  Friday October 19 2017 18:21:53 EST Ventricular Rate:  83 PR Interval:    QRS Duration: 129 QT Interval:  388 QTC Calculation: 456 R Axis:   86 Text Interpretation:  Sinus rhythm Left bundle branch block Baseline wander in lead(s) V2 Confirmed by Kristine RoyalMessick, Peter [id number]) on 10/19/2017 6:27:10 PM       Radiology Dg Chest 2 View  Result Date: 10/19/2017 CLINICAL  DATA:  Pt present from her doctor's office with a complaint of left sided head pain behind her eyes and left ear pain that radiates between both sites, HTN, and an abnormal EKG. Pt denies any chest pain. Hx of HTN. Non-smoker. EXAM: CHEST - 2 VIEW COMPARISON:  None. FINDINGS: Cardiac silhouette is borderline enlarged. No mediastinal or hilar masses. No evidence of adenopathy. Clear lungs.  No pleural effusion or pneumothorax. Skeletal structures are intact. IMPRESSION: No active cardiopulmonary disease. Electronically Signed   By: Amie Portlandavid  Ormond M.D.   On: 10/19/2017 19:36   Ct Head Wo Contrast  Result Date: 10/19/2017 CLINICAL DATA:  62 year old female with acute LEFT-sided headache today. EXAM: CT HEAD WITHOUT CONTRAST TECHNIQUE: Contiguous axial images were obtained from the base of the skull through the vertex without intravenous contrast.  COMPARISON:  None. FINDINGS: Brain: No evidence of acute infarction, hemorrhage, hydrocephalus, extra-axial collection or mass lesion/mass effect. Mild periventricular white matter hypodensities likely represent mild chronic small-vessel white matter ischemic changes. Vascular: Atherosclerotic calcifications noted. Skull: Normal. Negative for fracture or focal lesion. Sinuses/Orbits: No acute finding. Other: None. IMPRESSION: 1. No evidence of acute intracranial abnormality 2. Probable mild chronic small-vessel white matter ischemic changes. Electronically Signed   By: Harmon Pier M.D.   On: 10/19/2017 19:30    Procedures Procedures (including critical care time)  Medications Ordered in ED Medications - No data to display   Initial Impression / Assessment and Plan / ED Course  I have reviewed the triage vital signs and the nursing notes.  Pertinent labs & imaging results that were available during my care of the patient were reviewed by me and considered in my medical decision making (see chart for details).     BP 130/81   Pulse 74   Temp 98.9 F (37.2  C) (Oral)   Resp 17   SpO2 94%    Final Clinical Impressions(s) / ED Diagnoses   Final diagnoses:  Recurrent headache    ED Discharge Orders        Ordered    butalbital-acetaminophen-caffeine (FIORICET, ESGIC) 50-325-40 MG tablet  Every 6 hours PRN     10/19/17 2011     7:08 PM Patient here with intermittent left-sided facial pain for the past 3 days.  No focal neuro deficit on exam.  Will obtain head CT scan for further evaluation.  She was noted to Krystianna hypertensive initially at the PCP office with blood pressure of 202/100.  She received clonidine and currently her blood pressure is 137/79.  She is resting comfortably.  EKG shows left bundle branch block no prior for comparison.  Normal troponin and labs are reassuring.  Will obtain screening chest x-ray.  History of high blood pressure but currently not taking any blood pressure medication.  7:55 PM Currently blood pressure is 130/81.  Patient is resting comfortably.  Normal troponin, labs are reassuring, chest x-ray without active cardiopulmonary disease, had a CT scan showing no acute intracranial abnormality.  Probable mild chronic small vessel white matter ischemic changes.    At this time, I have low suspicion for subarachnoid hemorrhage, meningitis, or stroke causing her headache.  I do not think blood pressure medication is indicated at this time.  I encourage patient to follow-up with her primary care provider for further management of her condition.  Left bundle branch block on her EKG is likely chronic.  She has no chest pain.  Care discussed with Dr. Rodena Medin.    Fayrene Helper, PA-C 10/19/17 2012    Wynetta Fines, MD 10/20/17 [phone]

## 2018-02-19 ENCOUNTER — Ambulatory Visit: Payer: Self-pay | Admitting: Family Medicine

## 2018-02-19 NOTE — Telephone Encounter (Signed)
Son who is with pt c/o SOB for past 3 weeks. Pt's SOB worse after coughing. Denies fever, dizziness, runny nose. Pt c/o chest pain only with coughing and abdominal pain with coughing. Son states that SOB comes and goes. Episodes of SOB worse at night when laying in bed. Care advice given and son verbalized understanding. Pt given appt tomorrow at 0940. Instructed that if pt worsens to take to ED. Reason for Disposition . [1] MODERATE longstanding difficulty breathing (e.g., speaks in phrases, SOB even at rest, pulse 100-120) AND [2] SAME as normal  Answer Assessment - Initial Assessment Questions 1. RESPIRATORY STATUS: "Describe your breathing?" (e.g., wheezing, shortness of breath, unable to speak, severe coughing)      Shortness of breath  2. ONSET: "When did this breathing problem begin?"      3 weeks ago 3. PATTERN "Does the difficult breathing come and go, or has it been constant since it started?"      Comes and goes 4. SEVERITY: "How bad is your breathing?" (e.g., mild, moderate, severe)    - MILD: No SOB at rest, mild SOB with walking, speaks normally in sentences, can lay down, no retractions, pulse < 100.    - MODERATE: SOB at rest, SOB with minimal exertion and prefers to sit, cannot lie down flat, speaks in phrases, mild retractions, audible wheezing, pulse 100-120.    - SEVERE: Very SOB at rest, speaks in single words, struggling to breathe, sitting hunched forward, retractions, pulse > 120      moderate 5. RECURRENT SYMPTOM: "Have you had difficulty breathing before?" If so, ask: "When was the last time?" and "What happened that time?"      no 6. CARDIAC HISTORY: "Do you have any history of heart disease?" (e.g., heart attack, angina, bypass surgery, angioplasty)      HTN 7. LUNG HISTORY: "Do you have any history of lung disease?"  (e.g., pulmonary embolus, asthma, emphysema)     no 8. CAUSE: "What do you think is causing the breathing problem?"      Every time coughing becomes  SOB 9. OTHER SYMPTOMS: "Do you have any other symptoms? (e.g., dizziness, runny nose, cough, chest pain, fever)     Cough, chest pain and abdominal pain 10. PREGNANCY: "Is there any chance you are pregnant?" "When was your last menstrual period?"       n/a 11. TRAVEL: "Have you traveled out of the country in the last month?" (e.g., travel history, exposures)       no  Protocols used: BREATHING DIFFICULTY-A-AH

## 2018-02-20 ENCOUNTER — Other Ambulatory Visit: Payer: Self-pay

## 2018-02-20 ENCOUNTER — Ambulatory Visit (INDEPENDENT_AMBULATORY_CARE_PROVIDER_SITE_OTHER): Payer: Self-pay | Admitting: Family Medicine

## 2018-02-20 ENCOUNTER — Encounter: Payer: Self-pay | Admitting: Family Medicine

## 2018-02-20 VITALS — BP 195/93 | HR 80 | Temp 98.8°F | Ht 61.75 in | Wt 159.0 lb

## 2018-02-20 DIAGNOSIS — I447 Left bundle-branch block, unspecified: Secondary | ICD-10-CM

## 2018-02-20 DIAGNOSIS — Z131 Encounter for screening for diabetes mellitus: Secondary | ICD-10-CM

## 2018-02-20 DIAGNOSIS — I10 Essential (primary) hypertension: Secondary | ICD-10-CM

## 2018-02-20 LAB — HEMOGLOBIN A1C
Est. average glucose Bld gHb Est-mCnc: 223 mg/dL
Hgb A1c MFr Bld: 9.4 % — ABNORMAL HIGH (ref 4.8–5.6)

## 2018-02-20 MED ORDER — LOSARTAN POTASSIUM-HCTZ 50-12.5 MG PO TABS: 1.0000 | ORAL_TABLET | Freq: Every day | ORAL | 0 refills | Status: DC

## 2018-02-20 NOTE — Progress Notes (Signed)
Chief Complaint  Patient presents with  . Cough    wakes up coughing, SOB with nightmares  . Shortness of Breath  . Hypertension    189/94 in room     HPI  Uncontrolled Hypertension  Pt is here with a counseling from [facility]. She has a history of hypertension but has been non-compliant Her barrier is both language and education  Today she reports that she is not taking ANY blood pressure medication and that the ER doctor told her to take "tylenol for my headache but did not give me any medications". Pt has been having fatigue and weakness overnight She was coughing with mucus and drainage She did not take any otc could medicines She was seen in the ER for hypertension 10/19/17 She reports that when she was in the ER she does not know if she was given medications.   She reports that she did not take the medication prescribed for hypertension due to side effects.  She reports hives and itching with amlodipine and lisinopril-hctz  Past Medical History:  Diagnosis Date  . Hypertension     Current Outpatient Medications  Medication Sig Dispense Refill  . benzonatate (TESSALON) 100 MG capsule Take 1 capsule (100 mg total) by mouth 2 (two) times daily as needed for cough. 20 capsule 0  . amLODipine (NORVASC) 5 MG tablet Take 1 tablet (5 mg total) by mouth daily. (Patient not taking: Reported on 07/21/2016) 90 tablet 1  . butalbital-acetaminophen-caffeine (FIORICET, ESGIC) 50-325-40 MG tablet Take 1-2 tablets by mouth every 6 (six) hours as needed for headache. (Patient not taking: Reported on 02/20/2018) 20 tablet 0  . cetirizine (ZYRTEC) 10 MG tablet Take 1 tablet (10 mg total) by mouth daily. (Patient not taking: Reported on 02/20/2018) 30 tablet 11  . famotidine (PEPCID) 20 MG tablet Take 1 tablet (20 mg total) by mouth 2 (two) times daily. (Patient not taking: Reported on 02/20/2018) 60 tablet 1  . ibuprofen (ADVIL,MOTRIN) 200 MG tablet Take 200 mg by mouth every 6 (six) hours as  needed. Reported on 02/17/2016    . loratadine (CLARITIN) 10 MG tablet Take 1 tablet (10 mg total) by mouth daily. (Patient not taking: Reported on 02/20/2018) 30 tablet 3  . losartan-hydrochlorothiazide (HYZAAR) 50-12.5 MG tablet Take 1 tablet by mouth daily. 30 tablet 0  . meloxicam (MOBIC) 7.5 MG tablet Take 1 tablet (7.5 mg total) by mouth daily. (Patient not taking: Reported on 07/21/2016) 30 tablet 0  . omeprazole (PRILOSEC) 20 MG capsule Take 1 capsule (20 mg total) by mouth daily. (Patient not taking: Reported on 02/20/2018) 30 capsule 3   No current facility-administered medications for this visit.     Allergies:  Allergies  Allergen Reactions  . Tylenol [Acetaminophen] Rash  . Amlodipine Besylate Rash  . Lisinopril-Hydrochlorothiazide   . Bactrim [Sulfamethoxazole-Trimethoprim]     Rash   . Cephalexin   . Doxycycline     Rash and itching and lips feel burnt, no anaphylaxis  . Gentamicin Sulfate   . Penicillins   . Nsaids Rash  . Tetracyclines & Related Rash    No past surgical history on file.  Social History   Socioeconomic History  . Marital status: Married    Spouse name: Not on file  . Number of children: Not on file  . Years of education: Not on file  . Highest education level: Not on file  Occupational History  . Not on file  Social Needs  . Financial resource strain:  Not on file  . Food insecurity:    Worry: Not on file    Inability: Not on file  . Transportation needs:    Medical: Not on file    Non-medical: Not on file  Tobacco Use  . Smoking status: Never Smoker  . Smokeless tobacco: Never Used  Substance and Sexual Activity  . Alcohol use: Not on file  . Drug use: Not on file  . Sexual activity: Not on file  Lifestyle  . Physical activity:    Days per week: Not on file    Minutes per session: Not on file  . Stress: Not on file  Relationships  . Social connections:    Talks on phone: Not on file    Gets together: Not on file    Attends  religious service: Not on file    Active member of club or organization: Not on file    Attends meetings of clubs or organizations: Not on file    Relationship status: Not on file  Other Topics Concern  . Not on file  Social History Narrative  . Not on file    Family History  Problem Relation Age of Onset  . Hypertension Father      ROS Review of Systems See HPI Constitution: No fevers or chills No malaise No diaphoresis Skin: No rash or itching Eyes: no blurry vision, no double vision GU: no dysuria or hematuria Neuro: no dizziness or headaches all others reviewed and negative   Objective: Vitals:   02/20/18 0919 02/20/18 0942  BP: (!) 189/94 (!) 195/93  Pulse: 81 80  Temp: 98.8 F (37.1 C)   TempSrc: Oral   SpO2: 96%   Weight: 159 lb (72.1 kg)   Height: 5' 1.75" (1.568 m)     Physical Exam  Constitutional: She is oriented to person, place, and time. She appears well-developed and well-nourished.  HENT:  Head: Normocephalic and atraumatic.  Eyes: Pupils are equal, round, and reactive to light. EOM are normal.  Neck: Normal range of motion. Neck supple.  Cardiovascular: Normal rate, regular rhythm, normal heart sounds and intact distal pulses.  Pulmonary/Chest: Effort normal and breath sounds normal. No accessory muscle usage or stridor. No apnea, no tachypnea and no bradypnea. No respiratory distress. She has no decreased breath sounds. She has no wheezes. She has no rales.  Abdominal: Soft. Bowel sounds are normal. She exhibits no distension, no ascites and no mass. There is no splenomegaly or hepatomegaly. There is no tenderness. There is no rebound and no guarding.  Neurological: She is alert and oriented to person, place, and time.  Skin: Skin is warm. Capillary refill takes less than 2 seconds. No erythema.      EXAM: CT HEAD WITHOUT CONTRAST  TECHNIQUE: Contiguous axial images were obtained from the base of the skull through the vertex without  intravenous contrast.  COMPARISON:  None.  FINDINGS: Brain: No evidence of acute infarction, hemorrhage, hydrocephalus, extra-axial collection or mass lesion/mass effect.  Mild periventricular white matter hypodensities likely represent mild chronic small-vessel white matter ischemic changes.  Vascular: Atherosclerotic calcifications noted.  Skull: Normal. Negative for fracture or focal lesion.  Sinuses/Orbits: No acute finding.  Other: None.  IMPRESSION: 1. No evidence of acute intracranial abnormality 2. Probable mild chronic small-vessel white matter ischemic changes.   Electronically Signed   By: Harmon PierJeffrey  Hu M.D.   On: 10/19/2017 19:30  ECG 10/19/17 LBBB ECG 02/20/18 partial LBBB  Assessment and Plan Beverley was seen today for  cough, shortness of breath and hypertension.  Diagnoses and all orders for this visit:  Uncontrolled hypertension Likely her drug reaction are because of expired old drugs Will follow up with patient -     EKG 12-Lead -     Lipid panel -     Comprehensive metabolic panel -     TSH -     Hemoglobin A1c  LBBB (left bundle branch block)- asymptomatic currently  Referral to Cardiology  -     Ambulatory referral to Cardiology  Screening for diabetes mellitus- will screen with a1c Pt not fasting  -     Hemoglobin A1c  Other orders -     losartan-hydrochlorothiazide (HYZAAR) 50-12.5 MG tablet; Take 1 tablet by mouth daily.  Reviewed CT and ECG from ER Showed pt and her son the trend of her bp Discussed stroke/MI prevention Referral placed for Cardiology Screened for other modifiable risk factors Got Lipid panel even though pt had soup for breakfast Discussed possible side effects of bp meds DASH diet reviewed Close follow up planned for 2 weeks Reviewed ECG today with patient Patient and her son are agreeable  A total of 40 minutes were spent face-to-face with the patient during this encounter and over half of that time was  spent on counseling and coordination of care.    Deosha Werden A Alasdair Kleve

## 2018-02-20 NOTE — Patient Instructions (Addendum)
   IF you received an x-ray today, you will receive an invoice from [facility]. Please contact [facility] at [phone] with questions or concerns regarding your invoice.   IF you received labwork today, you will receive an invoice from [facility]. Please contact [facility] at [phone] with questions or concerns regarding your invoice.   Our billing staff will not Zeniah able to assist you with questions regarding bills from these companies.  You will Amaria contacted with the lab results as soon as they are available. The fastest way to get your results is to activate your My Chart account. Instructions are located on the last page of this paperwork. If you have not heard from us regarding the results in 2 weeks, please contact this office.      Managing Your Hypertension Hypertension is commonly called high blood pressure. This is when the force of your blood pressing against the walls of your arteries is too strong. Arteries are blood vessels that carry blood from your heart throughout your body. Hypertension forces the heart to work harder to pump blood, and may cause the arteries to become narrow or stiff. Having untreated or uncontrolled hypertension can cause heart attack, stroke, kidney disease, and other problems. What are blood pressure readings? A blood pressure reading consists of a higher number over a lower number. Ideally, your blood pressure should Alynn below 120/80. The first ("top") number is called the systolic pressure. It is a measure of the pressure in your arteries as your heart beats. The second ("bottom") number is called the diastolic pressure. It is a measure of the pressure in your arteries as the heart relaxes. What does my blood pressure reading mean? Blood pressure is classified into four stages. Based on your blood pressure reading, your health care provider may use the following stages to determine what type of treatment you need, if any. Systolic  pressure and diastolic pressure are measured in a unit called mm Hg. Normal  Systolic pressure: below 120.  Diastolic pressure: below 80. Elevated  Systolic pressure: 120-129.  Diastolic pressure: below 80. Hypertension stage 1  Systolic pressure: 130-139.  Diastolic pressure: 80-89. Hypertension stage 2  Systolic pressure: 140 or above.  Diastolic pressure: 90 or above. What health risks are associated with hypertension? Managing your hypertension is an important responsibility. Uncontrolled hypertension can lead to:  A heart attack.  A stroke.  A weakened blood vessel (aneurysm).  Heart failure.  Kidney damage.  Eye damage.  Metabolic syndrome.  Memory and concentration problems.  What changes can I make to manage my hypertension? Hypertension can Shiela managed by making lifestyle changes and possibly by taking medicines. Your health care provider will help you make a plan to bring your blood pressure within a normal range. Eating and drinking  Eat a diet that is high in fiber and potassium, and low in salt (sodium), added sugar, and fat. An example eating plan is called the DASH (Dietary Approaches to Stop Hypertension) diet. To eat this way: ? Eat plenty of fresh fruits and vegetables. Try to fill half of your plate at each meal with fruits and vegetables. ? Eat whole grains, such as whole wheat pasta, brown rice, or whole grain bread. Fill about one quarter of your plate with whole grains. ? Eat low-fat diary products. ? Avoid fatty cuts of meat, processed or cured meats, and poultry with skin. Fill about one quarter of your plate with lean proteins such as fish, chicken without skin, beans, eggs,   and tofu. ? Avoid premade and processed foods. These tend to Xuan higher in sodium, added sugar, and fat.  Reduce your daily sodium intake. Most people with hypertension should eat less than 1,500 mg of sodium a day.  Limit alcohol intake to no more than 1 drink a day  for nonpregnant women and 2 drinks a day for men. One drink equals 12 oz of beer, 5 oz of wine, or 1 oz of hard liquor. Lifestyle  Work with your health care provider to maintain a healthy body weight, or to lose weight. Ask what an ideal weight is for you.  Get at least 30 minutes of exercise that causes your heart to beat faster (aerobic exercise) most days of the week. Activities may include walking, swimming, or biking.  Include exercise to strengthen your muscles (resistance exercise), such as weight lifting, as part of your weekly exercise routine. Try to do these types of exercises for 30 minutes at least 3 days a week.  Do not use any products that contain nicotine or tobacco, such as cigarettes and e-cigarettes. If you need help quitting, ask your health care provider.  Control any long-term (chronic) conditions you have, such as high cholesterol or diabetes. Monitoring  Monitor your blood pressure at home as told by your health care provider. Your personal target blood pressure may vary depending on your medical conditions, your age, and other factors.  Have your blood pressure checked regularly, as often as told by your health care provider. Working with your health care provider  Review all the medicines you take with your health care provider because there may Mallerie side effects or interactions.  Talk with your health care provider about your diet, exercise habits, and other lifestyle factors that may Scarlette contributing to hypertension.  Visit your health care provider regularly. Your health care provider can help you create and adjust your plan for managing hypertension. Will I need medicine to control my blood pressure? Your health care provider may prescribe medicine if lifestyle changes are not enough to get your blood pressure under control, and if:  Your systolic blood pressure is 130 or higher.  Your diastolic blood pressure is 80 or higher.  Take medicines only as told  by your health care provider. Follow the directions carefully. Blood pressure medicines must Kacey taken as prescribed. The medicine does not work as well when you skip doses. Skipping doses also puts you at risk for problems. Contact a health care provider if:  You think you are having a reaction to medicines you have taken.  You have repeated (recurrent) headaches.  You feel dizzy.  You have swelling in your ankles.  You have trouble with your vision. Get help right away if:  You develop a severe headache or confusion.  You have unusual weakness or numbness, or you feel faint.  You have severe pain in your chest or abdomen.  You vomit repeatedly.  You have trouble breathing. Summary  Hypertension is when the force of blood pumping through your arteries is too strong. If this condition is not controlled, it may put you at risk for serious complications.  Your personal target blood pressure may vary depending on your medical conditions, your age, and other factors. For most people, a normal blood pressure is less than 120/80.  Hypertension is managed by lifestyle changes, medicines, or both. Lifestyle changes include weight loss, eating a healthy, low-sodium diet, exercising more, and limiting alcohol. This information is not intended to replace advice   given to you by your health care provider. Make sure you discuss any questions you have with your health care provider. Document Released: 04/24/2012 Document Revised: 06/28/2016 Document Reviewed: 06/28/2016 Elsevier Interactive Patient Education  2018 [employer]  

## 2018-02-21 LAB — LIPID PANEL
CHOL/HDL RATIO: 7.6 ratio — AB (ref 0.0–4.4)
Cholesterol, Total: 266 mg/dL — ABNORMAL HIGH (ref 100–199)
HDL: 35 mg/dL — ABNORMAL LOW (ref >39)
LDL CALC: 166 mg/dL — AB (ref 0–99)
Triglycerides: 325 mg/dL — ABNORMAL HIGH (ref 0–149)
VLDL CHOLESTEROL CAL: 65 mg/dL — AB (ref 5–40)

## 2018-02-21 LAB — COMPREHENSIVE METABOLIC PANEL
ALK PHOS: 107 IU/L (ref 39–117)
ALT: 24 IU/L (ref 0–32)
AST: 27 IU/L (ref 0–40)
Albumin/Globulin Ratio: 1.3 (ref 1.2–2.2)
Albumin: 4.5 g/dL (ref 3.6–4.8)
BUN/Creatinine Ratio: 9 — ABNORMAL LOW (ref 12–28)
BUN: 5 mg/dL — ABNORMAL LOW (ref 8–27)
Bilirubin Total: 0.4 mg/dL (ref 0.0–1.2)
CO2: 22 mmol/L (ref 20–29)
CREATININE: 0.56 mg/dL — AB (ref 0.57–1.00)
Calcium: 9.7 mg/dL (ref 8.7–10.3)
Chloride: 100 mmol/L (ref 96–106)
GFR calc Af Amer: 116 mL/min/{1.73_m2} (ref >59)
GFR calc non Af Amer: 100 mL/min/{1.73_m2} (ref >59)
GLOBULIN, TOTAL: 3.4 g/dL (ref 1.5–4.5)
Glucose: 135 mg/dL — ABNORMAL HIGH (ref 65–99)
POTASSIUM: 4.7 mmol/L (ref 3.5–5.2)
SODIUM: 141 mmol/L (ref 134–144)
Total Protein: 7.9 g/dL (ref 6.0–8.5)

## 2018-02-21 LAB — TSH: TSH: 1.63 u[IU]/mL (ref 0.450–4.500)

## 2018-02-22 ENCOUNTER — Telehealth: Payer: Self-pay | Admitting: Family Medicine

## 2018-02-22 NOTE — Telephone Encounter (Signed)
**  The interpretor needed for this pt is [place] Message was left on pt's vm to call for appt asap.

## 2018-02-22 NOTE — Telephone Encounter (Signed)
Scheduling pool can not give lab results--needs to go to clinical pool and they can transfer to a clerical member to make an apt.

## 2018-02-22 NOTE — Telephone Encounter (Signed)
-----   Message from Doristine BosworthZoe A Stallings, MD sent at 02/21/2018 11:44 PM EDT ----- Translator needed. Please notify the patient that she has a new diagnosis of diabetes based on her blood test. She also has very high cholesterol. I would like her to come back to clinic for teaching and prescribing medications. Please schedule her in the next 1-2 week for follow up.

## 2018-02-23 ENCOUNTER — Ambulatory Visit: Payer: Self-pay | Admitting: Family Medicine

## 2018-02-23 ENCOUNTER — Other Ambulatory Visit: Payer: Self-pay

## 2018-02-23 ENCOUNTER — Ambulatory Visit (INDEPENDENT_AMBULATORY_CARE_PROVIDER_SITE_OTHER): Payer: Self-pay

## 2018-02-23 ENCOUNTER — Encounter: Payer: Self-pay | Admitting: Family Medicine

## 2018-02-23 VITALS — BP 152/94 | HR 75 | Temp 97.6°F | Ht 61.0 in | Wt 161.6 lb

## 2018-02-23 DIAGNOSIS — Z789 Other specified health status: Secondary | ICD-10-CM

## 2018-02-23 DIAGNOSIS — R0789 Other chest pain: Secondary | ICD-10-CM

## 2018-02-23 DIAGNOSIS — R059 Cough, unspecified: Secondary | ICD-10-CM

## 2018-02-23 DIAGNOSIS — K143 Hypertrophy of tongue papillae: Secondary | ICD-10-CM

## 2018-02-23 DIAGNOSIS — R05 Cough: Secondary | ICD-10-CM

## 2018-02-23 DIAGNOSIS — R221 Localized swelling, mass and lump, neck: Secondary | ICD-10-CM

## 2018-02-23 DIAGNOSIS — R0602 Shortness of breath: Secondary | ICD-10-CM

## 2018-02-23 DIAGNOSIS — E785 Hyperlipidemia, unspecified: Secondary | ICD-10-CM

## 2018-02-23 DIAGNOSIS — E1165 Type 2 diabetes mellitus with hyperglycemia: Secondary | ICD-10-CM

## 2018-02-23 LAB — POCT CBC
Granulocyte percent: 73.4 %G (ref 37–80)
HCT, POC: 42 % (ref 37.7–47.9)
HEMOGLOBIN: 13.2 g/dL (ref 12.2–16.2)
LYMPH, POC: 2.1 (ref 0.6–3.4)
MCH: 25.5 pg — AB (ref 27–31.2)
MCHC: 31.4 g/dL — AB (ref 31.8–35.4)
MCV: 81.3 fL (ref 80–97)
MID (cbc): 0.4 (ref 0–0.9)
MPV: 7.1 fL (ref 0–99.8)
PLATELET COUNT, POC: 294 10*3/uL (ref 142–424)
POC Granulocyte: 6.8 (ref 2–6.9)
POC LYMPH PERCENT: 22.3 %L (ref 10–50)
POC MID %: 4.3 % (ref 0–12)
RBC: 5.17 M/uL (ref 4.04–5.48)
RDW, POC: 12.8 %
WBC: 9.2 10*3/uL (ref 4.6–10.2)

## 2018-02-23 LAB — POCT SKIN KOH: Skin KOH, POC: NEGATIVE

## 2018-02-23 MED ORDER — METFORMIN HCL 500 MG PO TABS: 500.0000 mg | ORAL_TABLET | Freq: Two times a day (BID) | ORAL | 1 refills | Status: DC

## 2018-02-23 MED ORDER — METFORMIN HCL 500 MG PO TABS: 500.0000 mg | ORAL_TABLET | Freq: Every day | ORAL | 1 refills | Status: DC

## 2018-02-23 MED ORDER — RANITIDINE HCL 150 MG PO TABS: 150.0000 mg | ORAL_TABLET | Freq: Two times a day (BID) | ORAL | 1 refills | Status: DC

## 2018-02-23 MED ORDER — LORATADINE 10 MG PO TABS: 10.0000 mg | ORAL_TABLET | Freq: Every day | ORAL | 1 refills | Status: DC

## 2018-02-23 NOTE — Progress Notes (Signed)
Subjective:  By signing my name below, I, Stann Ore, attest that this documentation has been prepared under the direction and in the presence of Meredith Staggers, MD. Electronically Signed: Stann Ore, Scribe. 02/23/2018 , 2:43 PM .  Patient was seen in Room 2 .   Patient ID: Madeline Bell, female    DOB: [birth year], 62 y.o.   MRN: [id number] Chief Complaint  Patient presents with  . Cough    it gives her SOB and chest pain due to the coughing. Going on a month now. (allergic to many medications she has taken in the past)   HPI Madeline Bell is a 62 y.o. female Here for cough for the past 1 month. She has a history of HTN, last seen 3 days ago (July 10th) by Dr. Creta Levin.   She's brought in by her daughter today, who translated for patient in room today. Patient's primary language is Montagn[place] Rhade.   Over 40 minutes of face-to-face care, over 50% of visit counseling.   [3:29 PM] Patient also mentions having a pounding sensation on her neck. ongoing for about 2-3 years. She was informed previously that it could Laticha due to her elevated BP.   HTN BP Readings from Last 3 Encounters:  02/23/18 (!) 152/94  02/20/18 (!) 195/93  10/19/17 127/83   Patient had been off of her BP medications at last visit. She had been having cough with mucus and drainage, with fatigue and weakness over night. She reported hives and itching with amlodipine and lisinopril-HCTZ. Exam reviewed without concerning findings. She had EKG done July 10th with partial LBBB, which was also noted in March. She was referred to cardiology. She was started on Losartan HCT 50-12.5 mg qd. Her last chest xray was in March, no active cardiopulmonary disease. She hasn't received call from cardiology yet.   She's been taking BP medications everyday. She noticed her hands started swelling last night, as well as white coating on her tongue; denies tongue swelling. She also mentions legs being itchy last night, and soaked them in salt  water for some relief. She mentions being highly intolerant and sensitive towards medications.   Cough Patient reports having cough with mucus in her throat, pain in her throat, chest pain and back pain for about a month now. She notes the cough is about the same as 3 days ago, and causing chest pain and back pain; gets painful with mucus. She denies any fever recently with the cough.   She notes chest tightness and shortness of breath ongoing for about a month with the cough, and worse at night. She denies any heartburn recently or in the past. Although, when she eats, she feels food sometimes become stuck and needs to drink water to help food go down.   Patient Active Problem List   Diagnosis Date Noted  . HTN (hypertension), benign 05/02/2014   Past Medical History:  Diagnosis Date  . Hypertension    No past surgical history on file. Allergies  Allergen Reactions  . Tylenol [Acetaminophen] Rash  . Amlodipine Besylate Rash  . Lisinopril-Hydrochlorothiazide   . Bactrim [Sulfamethoxazole-Trimethoprim]     Rash   . Cephalexin   . Doxycycline     Rash and itching and lips feel burnt, no anaphylaxis  . Gentamicin Sulfate   . Penicillins   . Nsaids Rash  . Tetracyclines & Related Rash   Prior to Admission medications   Medication Sig Start Date End Date Taking? Authorizing Provider  amLODipine (NORVASC) 5  MG tablet Take 1 tablet (5 mg total) by mouth daily. Patient not taking: Reported on 07/21/2016 05/02/14   Peyton Najjar, MD  benzonatate (TESSALON) 100 MG capsule Take 1 capsule (100 mg total) by mouth 2 (two) times daily as needed for cough. 07/21/16   Doristine Bosworth, MD  butalbital-acetaminophen-caffeine (FIORICET, ESGIC) 50-325-40 MG tablet Take 1-2 tablets by mouth every 6 (six) hours as needed for headache. Patient not taking: Reported on 02/20/2018 10/19/17 10/19/18  Fayrene Helper, PA-C  cetirizine (ZYRTEC) 10 MG tablet Take 1 tablet (10 mg total) by mouth daily. Patient not  taking: Reported on 02/20/2018 07/21/16   Doristine Bosworth, MD  famotidine (PEPCID) 20 MG tablet Take 1 tablet (20 mg total) by mouth 2 (two) times daily. Patient not taking: Reported on 02/20/2018 07/21/16   Doristine Bosworth, MD  ibuprofen (ADVIL,MOTRIN) 200 MG tablet Take 200 mg by mouth every 6 (six) hours as needed. Reported on 02/17/2016    [provider]  loratadine (CLARITIN) 10 MG tablet Take 1 tablet (10 mg total) by mouth daily. Patient not taking: Reported on 02/20/2018 07/21/16   Doristine Bosworth, MD  losartan-hydrochlorothiazide (HYZAAR) 50-12.5 MG tablet Take 1 tablet by mouth daily. 02/20/18   Doristine Bosworth, MD  meloxicam (MOBIC) 7.5 MG tablet Take 1 tablet (7.5 mg total) by mouth daily. Patient not taking: Reported on 07/21/2016 02/17/16   Trena Platt D, PA  omeprazole (PRILOSEC) 20 MG capsule Take 1 capsule (20 mg total) by mouth daily. Patient not taking: Reported on 02/20/2018 07/21/16   Doristine Bosworth, MD   Social History   Socioeconomic History  . Marital status: Married    Spouse name: Not on file  . Number of children: Not on file  . Years of education: Not on file  . Highest education level: Not on file  Occupational History  . Not on file  Social Needs  . Financial resource strain: Not on file  . Food insecurity:    Worry: Not on file    Inability: Not on file  . Transportation needs:    Medical: Not on file    Non-medical: Not on file  Tobacco Use  . Smoking status: Never Smoker  . Smokeless tobacco: Never Used  Substance and Sexual Activity  . Alcohol use: Not on file  . Drug use: Not on file  . Sexual activity: Not on file  Lifestyle  . Physical activity:    Days per week: Not on file    Minutes per session: Not on file  . Stress: Not on file  Relationships  . Social connections:    Talks on phone: Not on file    Gets together: Not on file    Attends religious service: Not on file    Active member of club or organization: Not on file      Attends meetings of clubs or organizations: Not on file    Relationship status: Not on file  . Intimate partner violence:    Fear of current or ex partner: Not on file    Emotionally abused: Not on file    Physically abused: Not on file    Forced sexual activity: Not on file  Other Topics Concern  . Not on file  Social History Narrative  . Not on file   Review of Systems  Constitutional: Negative for fatigue, fever and unexpected weight change.  HENT:       White coat tongue  Respiratory: Positive  for cough and shortness of breath. Negative for chest tightness.   Cardiovascular: Positive for chest pain. Negative for palpitations and leg swelling.  Gastrointestinal: Negative for abdominal pain and blood in stool.  Musculoskeletal: Positive for back pain and joint swelling (hands).  Neurological: Negative for dizziness, syncope, light-headedness and headaches.       Objective:   Physical Exam  Constitutional: She is oriented to person, place, and time. She appears well-developed and well-nourished. No distress.  HENT:  Head: Normocephalic and atraumatic.  Right Ear: Hearing, tympanic membrane, external ear and ear canal normal.  Left Ear: Hearing, tympanic membrane, external ear and ear canal normal.  Nose: Nose normal.  Mouth/Throat: Oropharynx is clear and moist and mucous membranes are normal. No oropharyngeal exudate.  White coating on her tongue  Eyes: Pupils are equal, round, and reactive to light. Conjunctivae and EOM are normal.  Neck: Carotid bruit is not present.  Pulsatile area on the right neck, greater than the left, at the carotid; no bruits over these areas  Cardiovascular: Normal rate, regular rhythm, normal heart sounds and intact distal pulses.  No murmur heard. Pulmonary/Chest: Effort normal and breath sounds normal. No respiratory distress. She has no wheezes. She has no rhonchi.  Abdominal: Soft. She exhibits no pulsatile midline mass. There is no  tenderness.  Musculoskeletal:  Upper extremities: no focal swelling No appreciable pedal edema  Neurological: She is alert and oriented to person, place, and time.  Skin: Skin is warm and dry. No rash noted.  Psychiatric: She has a normal mood and affect. Her behavior is normal.  Vitals reviewed.   Vitals:   02/23/18 1400 02/23/18 1405  BP: (!) 162/100 (!) 152/94  Pulse: 75   Temp: 97.6 F (36.4 C)   TempSrc: Oral   SpO2: 97%   Weight: 161 lb 9.6 oz (73.3 kg)   Height: 5\' 1"  (1.549 m)    Results for orders placed or performed in visit on 02/23/18  POCT CBC  Result Value Ref Range   WBC 9.2 4.6 - 10.2 K/uL   Lymph, poc 2.1 0.6 - 3.4   POC LYMPH PERCENT 22.3 10 - 50 %L   MID (cbc) 0.4 0 - 0.9   POC MID % 4.3 0 - 12 %M   POC Granulocyte 6.8 2 - 6.9   Granulocyte percent 73.4 37 - 80 %G   RBC 5.17 4.04 - 5.48 M/uL   Hemoglobin 13.2 12.2 - 16.2 g/dL   HCT, POC 02.7 25.3 - 47.9 %   MCV 81.3 80 - 97 fL   MCH, POC 25.5 (A) 27 - 31.2 pg   MCHC 31.4 (A) 31.8 - 35.4 g/dL   RDW, POC 66.4 %   Platelet Count, POC 294 142 - 424 K/uL   MPV 7.1 0 - 99.8 fL  POCT Skin KOH  Result Value Ref Range   Skin KOH, POC Negative Negative   Dg Chest 2 View  Result Date: 02/23/2018 CLINICAL DATA:  Short of breath for one month EXAM: CHEST - 2 VIEW COMPARISON:  10/19/2017 FINDINGS: The heart size and mediastinal contours are within normal limits. Both lungs are clear. The visualized skeletal structures are unremarkable. IMPRESSION: No active cardiopulmonary disease. Electronically Signed   By: Norva Pavlov M.D.   On: 02/23/2018 15:02       Assessment & Plan:    Asencion Bagdasarian is a 62 y.o. female Cough - Plan: DG Chest 2 View, Pro b natriuretic peptide, POCT CBC, ranitidine (ZANTAC)  150 MG tablet, loratadine (CLARITIN) 10 MG tablet Chest wall pain - Plan: DG Chest 2 View Shortness of breath - Plan: DG Chest 2 View  -Persistent cough, episodic shortness of breath, chest pain.  Similar symptoms  as in the past, no acute changes.  Reassuring chest x-ray.    Previous EKG noted, plan for cardiology follow-up.  Differential also includes upper airway cough syndrome with reflux or allergies.  -Continue follow-up with cardiology as planned.    - Start Zantac and Claritin to treat possible allergies or laryngopharyngeal reflux  -Will check a BNP with nighttime symptoms but less likely heart failure based on normal x-ray.  -ER/911 precautions if any acute worsening.  Type 2 diabetes mellitus with hyperglycemia, without long-term current use of insulin (HCC) - Plan: metFORMIN (GLUCOPHAGE) 500 MG tablet, DISCONTINUED: metFORMIN (GLUCOPHAGE) 500 MG tablet  -Discussed recent results with elevated A1c and glucose in level of diabetes.  Start metformin 500 mg daily for now, has had some difficulty tolerating medications in the past.  If she does tolerate metformin at this dose, can increase at next visit.  Handout given on diabetes.  Can discuss details further at future visit  Hyperlipidemia, unspecified hyperlipidemia type  Would typically recommend statin at her level, but as above had some difficulty tolerating medications.  -For simplicity will start with just metformin to begin with, and other treatments for cough as above.  Consider statin at future visit.  Tongue coating - Plan: POCT Skin KOH  -Negative scraping for thrush, but still possible.  If persistent symptoms, consider Mycelex troche  Pulsatile neck mass - Plan: US Carotid Duplex Bilateral  -Reports symptoms have been present for some time.  We will initially schedule ultrasound to rule out potential carotid artery aneurysm, but may need more definitive imaging with CT angiogram.    Language barrier  -Offered interpreter, daughter present who interpreted which was patient's choice.  Understanding expressed.    Meds ordered this encounter  Medications  . DISCONTD: metFORMIN (GLUCOPHAGE) 500 MG tablet    Sig: Take 1 tablet (500 mg  total) by mouth 2 (two) times daily with a meal.    Dispense:  180 tablet    Refill:  1  . ranitidine (ZANTAC) 150 MG tablet    Sig: Take 1 tablet (150 mg total) by mouth 2 (two) times daily.    Dispense:  60 tablet    Refill:  1  . loratadine (CLARITIN) 10 MG tablet    Sig: Take 1 tablet (10 mg total) by mouth daily.    Dispense:  30 tablet    Refill:  1  . metFORMIN (GLUCOPHAGE) 500 MG tablet    Sig: Take 1 tablet (500 mg total) by mouth daily with breakfast.    Dispense:  30 tablet    Refill:  1   Patient Instructions   Cough may Sri due to multiple causes.  Start zantac 150mg  twice per day, and claritin once per day for possible allergies or heartburn. If not helping throat symptoms in next 2 weeks, return to discuss other causes or may refer you to ENT (throat specialist).  Follow up with cardiologist as planned. I will check heart failure test. If any worsening chest pain Cyndal seen in ER or call 911.   You have diabetes based on lab results. Start metformin once per day for now. If tolerated, we may increase the dose at next visit.   Cholesterol was elevated and would recommend a statin medication, but with  other new meds, I would like to wait to start treatment for now.   Neck symptoms may Mauria due to possible aneurysm of carotid artery.  We need to continue to work on getting the blood pressure down, but will also schedule an ultrasound to look at the area further.   No sign of thrush on tongue. Recheck that area in next 2 weeks.   Return to the clinic or go to the nearest emergency room if any of your symptoms worsen or new symptoms occur.   Nonspecific Chest Pain Chest pain can Lazariah caused by many different conditions. There is always a chance that your pain could Sundy related to something serious, such as a heart attack or a blood clot in your lungs. Chest pain can also Kashari caused by conditions that are not life-threatening. If you have chest pain, it is very important to follow  up with your health care provider. What are the causes? Causes of this condition include:  Heartburn.  Pneumonia or bronchitis.  Anxiety or stress.  Inflammation around your heart (pericarditis) or lung (pleuritis or pleurisy).  A blood clot in your lung.  A collapsed lung (pneumothorax). This can develop suddenly on its own (spontaneous pneumothorax) or from trauma to the chest.  Shingles infection (varicella-zoster virus).  Heart attack.  Damage to the bones, muscles, and cartilage that make up your chest wall. This can include: ? Bruised bones due to injury. ? Strained muscles or cartilage due to frequent or repeated coughing or overwork. ? Fracture to one or more ribs. ? Sore cartilage due to inflammation (costochondritis).  What increases the risk? Risk factors for this condition may include:  Activities that increase your risk for trauma or injury to your chest.  Respiratory infections or conditions that cause frequent coughing.  Medical conditions or overeating that can cause heartburn.  Heart disease or family history of heart disease.  Conditions or health behaviors that increase your risk of developing a blood clot.  Having had chicken pox (varicella zoster).  What are the signs or symptoms? Chest pain can feel like:  Burning or tingling on the surface of your chest or deep in your chest.  Crushing, pressure, aching, or squeezing pain.  Dull or sharp pain that is worse when you move, cough, or take a deep breath.  Pain that is also felt in your back, neck, shoulder, or arm, or pain that spreads to any of these areas.  Your chest pain may come and go, or it may stay constant. How is this diagnosed? Lab tests or other studies may Merrisa needed to find the cause of your pain. Your health care provider may have you take a test called an ECG (electrocardiogram). An ECG records your heartbeat patterns at the time the test is performed. You may also have other  tests, such as:  Transthoracic echocardiogram (TTE). In this test, sound waves are used to create a picture of the heart structures and to look at how blood flows through your heart.  Transesophageal echocardiogram (TEE).This is a more advanced imaging test that takes images from inside your body. It allows your health care provider to see your heart in finer detail.  Cardiac monitoring. This allows your health care provider to monitor your heart rate and rhythm in real time.  Holter monitor. This is a portable device that records your heartbeat and can help to diagnose abnormal heartbeats. It allows your health care provider to track your heart activity for several days, if  needed.  Stress tests. These can Amandeep done through exercise or by taking medicine that makes your heart beat more quickly.  Blood tests.  Other imaging tests.  How is this treated? Treatment depends on what is causing your chest pain. Treatment may include:  Medicines. These may include: ? Acid blockers for heartburn. ? Anti-inflammatory medicine. ? Pain medicine for inflammatory conditions. ? Antibiotic medicine, if an infection is present. ? Medicines to dissolve blood clots. ? Medicines to treat coronary artery disease (CAD).  Supportive care for conditions that do not require medicines. This may include: ? Resting. ? Applying heat or cold packs to injured areas. ? Limiting activities until pain decreases.  Follow these instructions at home: Medicines  If you were prescribed an antibiotic, take it as told by your health care provider. Do not stop taking the antibiotic even if you start to feel better.  Take over-the-counter and prescription medicines only as told by your health care provider. Lifestyle  Do not use any products that contain nicotine or tobacco, such as cigarettes and e-cigarettes. If you need help quitting, ask your health care provider.  Do not drink alcohol.  Make lifestyle changes  as directed by your health care provider. These may include: ? Getting regular exercise. Ask your health care provider to suggest some activities that are safe for you. ? Eating a heart-healthy diet. A registered dietitian can help you to learn healthy eating options. ? Maintaining a healthy weight. ? Managing diabetes, if necessary. ? Reducing stress, such as with yoga or relaxation techniques. General instructions  Avoid any activities that bring on chest pain.  If heartburn is the cause for your chest pain, raise (elevate) the head of your bed about 6 inches (15 cm) by putting blocks under the legs. Sleeping with more pillows does not effectively relieve heartburn because it only changes the position of your head.  Keep all follow-up visits as told by your health care provider. This is important. This includes any further testing if your chest pain does not go away. Contact a health care provider if:  Your chest pain does not go away.  You have a rash with blisters on your chest.  You have a fever.  You have chills. Get help right away if:  Your chest pain is worse.  You have a cough that gets worse, or you cough up blood.  You have severe pain in your abdomen.  You have severe weakness.  You faint.  You have sudden, unexplained chest discomfort.  You have sudden, unexplained discomfort in your arms, back, neck, or jaw.  You have shortness of breath at any time.  You suddenly start to sweat, or your skin gets clammy.  You feel nauseous or you vomit.  You suddenly feel light-headed or dizzy.  Your heart begins to beat quickly, or it feels like it is skipping beats. These symptoms may represent a serious problem that is an emergency. Do not wait to see if the symptoms will go away. Get medical help right away. Call your local emergency services (911 in the [place] Do not drive yourself to the hospital. This information is not intended to replace advice given to you by  your health care provider. Make sure you discuss any questions you have with your health care provider. Document Released: 05/10/2005 Document Revised: 04/24/2016 Document Reviewed: 04/24/2016 Elsevier Interactive Patient Education  2017 [employer] Inc[employer]   Type 2 Diabetes Mellitus, Diagnosis, Adult Type 2 diabetes (type 2 diabetes mellitus) is  a long-term (chronic) disease. It may Jennessy caused by one or both of these problems:  Your body does not make enough of a hormone called insulin.  Your body does not react in a normal way to insulin that it makes.  Insulin lets sugars (glucose) go into cells in the body. This gives you energy. If you have type 2 diabetes, sugars cannot get into cells. This causes high blood sugar (hyperglycemia). Your doctor will set treatment goals for you. Generally, you should have these blood sugar levels:  Before meals (preprandial): 80-130 mg/dL (4.0-9.8 mmol/L).  After meals (postprandial): below 180 mg/dL (10 mmol/L).  A1c (hemoglobin A1c) level: less than 7%.  Follow these instructions at home: Questions to Ask Your Doctor  You may want to ask these questions:  Do I need to meet with a diabetes educator?  Where can I find a support group for people with diabetes?  What equipment will I need to care for myself at home?  What diabetes medicines do I need? When should I take them?  How often do I need to check my blood sugar?  What number can I call if I have questions?  When is my next doctor's visit?  General instructions  Take over-the-counter and prescription medicines only as told by your doctor.  Keep all follow-up visits as told by your doctor. This is important. Contact a doctor if:  Your blood sugar is at or above 240 mg/dL (11.9 mmol/L) for 2 days in a row.  You have been sick or have had a fever for 2 days or more and you are not getting better.  You have any of these problems for more than 6 hours: ? You cannot eat or  drink. ? You feel sick to your stomach (nauseous). ? You throw up (vomit). ? You have watery poop (diarrhea). Get help right away if:  Your blood sugar is lower than 54 mg/dL (3 mmol/L).  You get confused.  You have trouble: ? Thinking clearly. ? Breathing.  You have moderate or large ketone levels in your pee (urine). This information is not intended to replace advice given to you by your health care provider. Make sure you discuss any questions you have with your health care provider. Document Released: 05/09/2008 Document Revised: 01/06/2016 Document Reviewed: 09/03/2015 Elsevier Interactive Patient Education  2018 [employer] Inc.   Cough, Adult Coughing is a reflex that clears your throat and your airways. Coughing helps to heal and protect your lungs. It is normal to cough occasionally, but a cough that happens with other symptoms or lasts a long time may Emmalynn a sign of a condition that needs treatment. A cough may last only 2-3 weeks (acute), or it may last longer than 8 weeks (chronic). What are the causes? Coughing is commonly caused by:  Breathing in substances that irritate your lungs.  A viral or bacterial respiratory infection.  Allergies.  Asthma.  Postnasal drip.  Smoking.  Acid backing up from the stomach into the esophagus (gastroesophageal reflux).  Certain medicines.  Chronic lung problems, including COPD (or rarely, lung cancer).  Other medical conditions such as heart failure.  Follow these instructions at home: Pay attention to any changes in your symptoms. Take these actions to help with your discomfort:  Take medicines only as told by your health care provider. ? If you were prescribed an antibiotic medicine, take it as told by your health care provider. Do not stop taking the antibiotic even if you start to  feel better. ? Talk with your health care provider before you take a cough suppressant medicine.  Drink enough fluid to keep your urine  clear or pale yellow.  If the air is dry, use a cold steam vaporizer or humidifier in your bedroom or your home to help loosen secretions.  Avoid anything that causes you to cough at work or at home.  If your cough is worse at night, try sleeping in a semi-upright position.  Avoid cigarette smoke. If you smoke, quit smoking. If you need help quitting, ask your health care provider.  Avoid caffeine.  Avoid alcohol.  Rest as needed.  Contact a health care provider if:  You have new symptoms.  You cough up pus.  Your cough does not get better after 2-3 weeks, or your cough gets worse.  You cannot control your cough with suppressant medicines and you are losing sleep.  You develop pain that is getting worse or pain that is not controlled with pain medicines.  You have a fever.  You have unexplained weight loss.  You have night sweats. Get help right away if:  You cough up blood.  You have difficulty breathing.  Your heartbeat is very fast. This information is not intended to replace advice given to you by your health care provider. Make sure you discuss any questions you have with your health care provider. Document Released: 01/27/2011 Document Revised: 01/06/2016 Document Reviewed: 10/07/2014 Elsevier Interactive Patient Education  2018 [employer].   IF you received an x-ray today, you will receive an invoice from [facility]. Please contact [facility] at [phone] with questions or concerns regarding your invoice.   IF you received labwork today, you will receive an invoice from [facility]. Please contact [facility] at [phone] with questions or concerns regarding your invoice.   Our billing staff will not Nadiah able to assist you with questions regarding bills from these companies.  You will Ger contacted with the lab results as soon as they are available. The fastest way to get your results is to activate your My Chart account. Instructions are  located on the last page of this paperwork. If you have not heard from [place] regarding the results in 2 weeks, please contact this office.       I personally performed the services described in this documentation, which was scribed in my presence. The recorded information has been reviewed and considered for accuracy and completeness, addended by me as needed, and agree with information above.  Signed,   Meredith Staggers, MD [facility] at [facility].  02/24/18 10:12 PM

## 2018-02-23 NOTE — Patient Instructions (Addendum)
Cough may Madeline Bell due to multiple causes.  Start zantac 150mg  twice per day, and claritin once per day for possible allergies or heartburn. If not helping throat symptoms in next 2 weeks, return to discuss other causes or may refer you to ENT (throat specialist).  Follow up with cardiologist as planned. I will check heart failure test. If any worsening chest pain Madeline Bell seen in ER or call 911.   You have diabetes based on lab results. Start metformin once per day for now. If tolerated, we may increase the dose at next visit.   Cholesterol was elevated and would recommend a statin medication, but with other new meds, I would like to wait to start treatment for now.   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  We need to continue to work on getting the blood pressure down, but will also schedule an ultrasound to look at the area further.   No sign of thrush on tongue. Recheck that area in next 2 weeks.   Return to the clinic or go to the nearest emergency room if any of your symptoms worsen or new symptoms occur.   Nonspecific Chest Pain Chest pain can Etter caused by many different conditions. There is always a chance that your pain could Dreya related to something serious, such as a heart attack or a blood clot in your lungs. Chest pain can also Shaquan caused by conditions that are not life-threatening. If you have chest pain, it is very important to follow up with your health care provider. What are the causes? Causes of this condition include:  Heartburn.  Pneumonia or bronchitis.  Anxiety or stress.  Inflammation around your heart (pericarditis) or lung (pleuritis or pleurisy).  A blood clot in your lung.  A collapsed lung (pneumothorax). This can develop suddenly on its own (spontaneous pneumothorax) or from trauma to the chest.  Shingles infection (varicella-zoster virus).  Heart attack.  Damage to the bones, muscles, and cartilage that make up your chest wall. This can  include: ? Bruised bones due to injury. ? Strained muscles or cartilage due to frequent or repeated coughing or overwork. ? Fracture to one or more ribs. ? Sore cartilage due to inflammation (costochondritis).  What increases the risk? Risk factors for this condition may include:  Activities that increase your risk for trauma or injury to your chest.  Respiratory infections or conditions that cause frequent coughing.  Medical conditions or overeating that can cause heartburn.  Heart disease or family history of heart disease.  Conditions or health behaviors that increase your risk of developing a blood clot.  Having had chicken pox (varicella zoster).  What are the signs or symptoms? Chest pain can feel like:  Burning or tingling on the surface of your chest or deep in your chest.  Crushing, pressure, aching, or squeezing pain.  Dull or sharp pain that is worse when you move, cough, or take a deep breath.  Pain that is also felt in your back, neck, shoulder, or arm, or pain that spreads to any of these areas.  Your chest pain may come and go, or it may stay constant. How is this diagnosed? Lab tests or other studies may Madeline Bell needed to find the cause of your pain. Your health care provider may have you take a test called an ECG (electrocardiogram). An ECG records your heartbeat patterns at the time the test is performed. You may also have other tests, such as:  Transthoracic echocardiogram (  TTE). In this test, sound waves are used to create a picture of the heart structures and to look at how blood flows through your heart.  Transesophageal echocardiogram (TEE).This is a more advanced imaging test that takes images from inside your body. It allows your health care provider to see your heart in finer detail.  Cardiac monitoring. This allows your health care provider to monitor your heart rate and rhythm in real time.  Holter monitor. This is a portable device that records your  heartbeat and can help to diagnose abnormal heartbeats. It allows your health care provider to track your heart activity for several days, if needed.  Stress tests. These can Keyonna done through exercise or by taking medicine that makes your heart beat more quickly.  Blood tests.  Other imaging tests.  How is this treated? Treatment depends on what is causing your chest pain. Treatment may include:  Medicines. These may include: ? Acid blockers for heartburn. ? Anti-inflammatory medicine. ? Pain medicine for inflammatory conditions. ? Antibiotic medicine, if an infection is present. ? Medicines to dissolve blood clots. ? Medicines to treat coronary artery disease (CAD).  Supportive care for conditions that do not require medicines. This may include: ? Resting. ? Applying heat or cold packs to injured areas. ? Limiting activities until pain decreases.  Follow these instructions at home: Medicines  If you were prescribed an antibiotic, take it as told by your health care provider. Do not stop taking the antibiotic even if you start to feel better.  Take over-the-counter and prescription medicines only as told by your health care provider. Lifestyle  Do not use any products that contain nicotine or tobacco, such as cigarettes and e-cigarettes. If you need help quitting, ask your health care provider.  Do not drink alcohol.  Make lifestyle changes as directed by your health care provider. These may include: ? Getting regular exercise. Ask your health care provider to suggest some activities that are safe for you. ? Eating a heart-healthy diet. A registered dietitian can help you to learn healthy eating options. ? Maintaining a healthy weight. ? Managing diabetes, if necessary. ? Reducing stress, such as with yoga or relaxation techniques. General instructions  Avoid any activities that bring on chest pain.  If heartburn is the cause for your chest pain, raise (elevate) the head  of your bed about 6 inches (15 cm) by putting blocks under the legs. Sleeping with more pillows does not effectively relieve heartburn because it only changes the position of your head.  Keep all follow-up visits as told by your health care provider. This is important. This includes any further testing if your chest pain does not go away. Contact a health care provider if:  Your chest pain does not go away.  You have a rash with blisters on your chest.  You have a fever.  You have chills. Get help right away if:  Your chest pain is worse.  You have a cough that gets worse, or you cough up blood.  You have severe pain in your abdomen.  You have severe weakness.  You faint.  You have sudden, unexplained chest discomfort.  You have sudden, unexplained discomfort in your arms, back, neck, or jaw.  You have shortness of breath at any time.  You suddenly start to sweat, or your skin gets clammy.  You feel nauseous or you vomit.  You suddenly feel light-headed or dizzy.  Your heart begins to beat quickly, or it feels like  it is skipping beats. These symptoms may represent a serious problem that is an emergency. Do not wait to see if the symptoms will go away. Get medical help right away. Call your local emergency services (911 in the [place] Do not drive yourself to the hospital. This information is not intended to replace advice given to you by your health care provider. Make sure you discuss any questions you have with your health care provider. Document Released: 05/10/2005 Document Revised: 04/24/2016 Document Reviewed: 04/24/2016 Elsevier Interactive Patient Education  2017 Elsevier Inc.   Type 2 Diabetes Mellitus, Diagnosis, Adult Type 2 diabetes (type 2 diabetes mellitus) is a long-term (chronic) disease. It may Madeline Bell caused by one or both of these problems:  Your body does not make enough of a hormone called insulin.  Your body does not react in a normal way to insulin  that it makes.  Insulin lets sugars (glucose) go into cells in the body. This gives you energy. If you have type 2 diabetes, sugars cannot get into cells. This causes high blood sugar (hyperglycemia). Your doctor will set treatment goals for you. Generally, you should have these blood sugar levels:  Before meals (preprandial): 80-130 mg/dL (1.6-1.0 mmol/L).  After meals (postprandial): below 180 mg/dL (10 mmol/L).  A1c (hemoglobin A1c) level: less than 7%.  Follow these instructions at home: Questions to Ask Your Doctor  You may want to ask these questions:  Do I need to meet with a diabetes educator?  Where can I find a support group for people with diabetes?  What equipment will I need to care for myself at home?  What diabetes medicines do I need? When should I take them?  How often do I need to check my blood sugar?  What number can I call if I have questions?  When is my next doctor's visit?  General instructions  Take over-the-counter and prescription medicines only as told by your doctor.  Keep all follow-up visits as told by your doctor. This is important. Contact a doctor if:  Your blood sugar is at or above 240 mg/dL (96.0 mmol/L) for 2 days in a row.  You have been sick or have had a fever for 2 days or more and you are not getting better.  You have any of these problems for more than 6 hours: ? You cannot eat or drink. ? You feel sick to your stomach (nauseous). ? You throw up (vomit). ? You have watery poop (diarrhea). Get help right away if:  Your blood sugar is lower than 54 mg/dL (3 mmol/L).  You get confused.  You have trouble: ? Thinking clearly. ? Breathing.  You have moderate or large ketone levels in your pee (urine). This information is not intended to replace advice given to you by your health care provider. Make sure you discuss any questions you have with your health care provider. Document Released: 05/09/2008 Document Revised:  01/06/2016 Document Reviewed: 09/03/2015 Elsevier Interactive Patient Education  2018 Elsevier Inc.   Cough, Adult Coughing is a reflex that clears your throat and your airways. Coughing helps to heal and protect your lungs. It is normal to cough occasionally, but a cough that happens with other symptoms or lasts a long time may Lajada a sign of a condition that needs treatment. A cough may last only 2-3 weeks (acute), or it may last longer than 8 weeks (chronic). What are the causes? Coughing is commonly caused by:  Breathing in substances that irritate your lungs.  A viral or bacterial respiratory infection.  Allergies.  Asthma.  Postnasal drip.  Smoking.  Acid backing up from the stomach into the esophagus (gastroesophageal reflux).  Certain medicines.  Chronic lung problems, including COPD (or rarely, lung cancer).  Other medical conditions such as heart failure.  Follow these instructions at home: Pay attention to any changes in your symptoms. Take these actions to help with your discomfort:  Take medicines only as told by your health care provider. ? If you were prescribed an antibiotic medicine, take it as told by your health care provider. Do not stop taking the antibiotic even if you start to feel better. ? Talk with your health care provider before you take a cough suppressant medicine.  Drink enough fluid to keep your urine clear or pale yellow.  If the air is dry, use a cold steam vaporizer or humidifier in your bedroom or your home to help loosen secretions.  Avoid anything that causes you to cough at work or at home.  If your cough is worse at night, try sleeping in a semi-upright position.  Avoid cigarette smoke. If you smoke, quit smoking. If you need help quitting, ask your health care provider.  Avoid caffeine.  Avoid alcohol.  Rest as needed.  Contact a health care provider if:  You have new symptoms.  You cough up pus.  Your cough does not  get better after 2-3 weeks, or your cough gets worse.  You cannot control your cough with suppressant medicines and you are losing sleep.  You develop pain that is getting worse or pain that is not controlled with pain medicines.  You have a fever.  You have unexplained weight loss.  You have night sweats. Get help right away if:  You cough up blood.  You have difficulty breathing.  Your heartbeat is very fast. This information is not intended to replace advice given to you by your health care provider. Make sure you discuss any questions you have with your health care provider. Document Released: 01/27/2011 Document Revised: 01/06/2016 Document Reviewed: 10/07/2014 Elsevier Interactive Patient Education  2018 [employer].   IF you received an x-ray today, you will receive an invoice from [facility]. Please contact [facility] at [phone] with questions or concerns regarding your invoice.   IF you received labwork today, you will receive an invoice from [facility]. Please contact [facility] at [phone] with questions or concerns regarding your invoice.   Our billing staff will not Nannette able to assist you with questions regarding bills from these companies.  You will Madeline Bell contacted with the lab results as soon as they are available. The fastest way to get your results is to activate your My Chart account. Instructions are located on the last page of this paperwork. If you have not heard from [place] regarding the results in 2 weeks, please contact this office.

## 2018-02-24 LAB — PRO B NATRIURETIC PEPTIDE: NT-Pro BNP: 12 pg/mL (ref 0–287)

## 2018-03-06 ENCOUNTER — Ambulatory Visit (INDEPENDENT_AMBULATORY_CARE_PROVIDER_SITE_OTHER): Payer: Self-pay | Admitting: Family Medicine

## 2018-03-06 ENCOUNTER — Other Ambulatory Visit: Payer: Self-pay

## 2018-03-06 ENCOUNTER — Ambulatory Visit
Admission: RE | Admit: 2018-03-06 | Discharge: 2018-03-06 | Disposition: A | Discharge status: Home / Self Care | Payer: Self-pay | Source: Ambulatory Visit | Attending: Family Medicine | Admitting: Family Medicine

## 2018-03-06 ENCOUNTER — Encounter: Payer: Self-pay | Admitting: Family Medicine

## 2018-03-06 VITALS — BP 132/72 | HR 98 | Temp 98.6°F | Resp 16 | Ht 61.0 in | Wt 161.6 lb

## 2018-03-06 DIAGNOSIS — I10 Essential (primary) hypertension: Secondary | ICD-10-CM

## 2018-03-06 DIAGNOSIS — E119 Type 2 diabetes mellitus without complications: Secondary | ICD-10-CM

## 2018-03-06 DIAGNOSIS — R221 Localized swelling, mass and lump, neck: Secondary | ICD-10-CM

## 2018-03-06 MED ORDER — GLUCOSE BLOOD VI STRP: ORAL_STRIP | 12 refills | Status: DC

## 2018-03-06 MED ORDER — FREESTYLE LIBRE 14 DAY SENSOR MISC: 1.0000 | 6 refills | Status: DC

## 2018-03-06 MED ORDER — FREESTYLE LIBRE 14 DAY READER DEVI: 1.0000 | Freq: Four times a day (QID) | 0 refills | Status: DC

## 2018-03-06 MED ORDER — OMEPRAZOLE 20 MG PO CPDR: 20.0000 mg | DELAYED_RELEASE_CAPSULE | Freq: Every day | ORAL | 3 refills | Status: DC

## 2018-03-06 NOTE — Progress Notes (Signed)
Chief Complaint  Patient presents with  . Hypertension    follow up    HPI  Hypertension: Patient here for follow-up of elevated blood pressure. She reports that when she takes all the medications together she gets dizziness and some fatigue. She takes them all together. She denies chest pains  GI symptoms She has heart burning and takes zantac It does not seem to Emie helping her She denies vomiting, blood bp, wheezing, intermittent cough She eats most meals at home    Lab Results  Component Value Date   HGBA1C 9.4 (H) 02/20/2018     Past Medical History:  Diagnosis Date  . Diabetes mellitus without complication (HCC)   . Dysphagia    esophageal dysphagia  . GERD (gastroesophageal reflux disease)   . Hiatal hernia 03/26/2018   EGD 03/26/18 shows gastritis and hiatal hernia 4cm  . Hypertension   . Sickle cell anemia (HCC)    trait    Current Outpatient Medications  Medication Sig Dispense Refill  . loratadine (CLARITIN) 10 MG tablet Take 1 tablet (10 mg total) by mouth daily. 30 tablet 1  . Continuous Blood Gluc Receiver (FREESTYLE LIBRE 14 DAY READER) DEVI 1 Device by Does not apply route QID. Use to check blood glucose 3 times a day 1 Device 0  . Continuous Blood Gluc Sensor (FREESTYLE LIBRE 14 DAY SENSOR) MISC 1 Device by Does not apply route every 14 (fourteen) days. 2 each 6  . glucose blood test strip Use as instructed 100 each 12  . losartan-hydrochlorothiazide (HYZAAR) 50-12.5 MG tablet TAKE 1 TABLET BY MOUTH EVERY DAY 30 tablet 0  . metFORMIN (GLUCOPHAGE) 500 MG tablet Take 1 tablet (500 mg total) by mouth 2 (two) times daily with a meal. 60 tablet 1  . omeprazole (PRILOSEC) 20 MG capsule Take 1 capsule (20 mg total) by mouth 2 (two) times daily. 30 capsule 3   No current facility-administered medications for this visit.     Allergies:  Allergies  Allergen Reactions  . Tylenol [Acetaminophen] Rash  . Amlodipine Besylate Rash  .  Lisinopril-Hydrochlorothiazide   . Bactrim [Sulfamethoxazole-Trimethoprim]     Rash   . Cephalexin   . Doxycycline     Rash and itching and lips feel burnt, no anaphylaxis  . Gentamicin Sulfate   . Penicillins   . Nsaids Rash  . Tetracyclines & Related Rash    No past surgical history on file.  Social History   Socioeconomic History  . Marital status: Married    Spouse name: Not on file  . Number of children: Not on file  . Years of education: Not on file  . Highest education level: Not on file  Occupational History  . Not on file  Social Needs  . Financial resource strain: Not on file  . Food insecurity:    Worry: Not on file    Inability: Not on file  . Transportation needs:    Medical: Not on file    Non-medical: Not on file  Tobacco Use  . Smoking status: Never Smoker  . Smokeless tobacco: Never Used  Substance and Sexual Activity  . Alcohol use: Not Currently  . Drug use: Never  . Sexual activity: Not on file  Lifestyle  . Physical activity:    Days per week: Not on file    Minutes per session: Not on file  . Stress: Not on file  Relationships  . Social connections:    Talks on phone: Not on file  Gets together: Not on file    Attends religious service: Not on file    Active member of club or organization: Not on file    Attends meetings of clubs or organizations: Not on file    Relationship status: Not on file  Other Topics Concern  . Not on file  Social History Narrative  . Not on file    Family History  Problem Relation Age of Onset  . Hypertension Father   . Colon cancer Neg Hx   . Esophageal cancer Neg Hx   . Rectal cancer Neg Hx   . Stomach cancer Neg Hx      ROS Review of Systems See HPI Constitution: No fevers or chills No malaise No diaphoresis Skin: No rash or itching Eyes: no blurry vision, no double vision GU: no dysuria or hematuria Neuro: no dizziness or headaches all others reviewed and negative    Objective: Vitals:   03/06/18 1059  BP: 132/72  Pulse: 98  Resp: 16  Temp: 98.6 F (37 C)  TempSrc: Oral  SpO2: 95%  Weight: 161 lb 9.6 oz (73.3 kg)  Height: 5\' 1"  (1.549 m)    Physical Exam  Constitutional: She is oriented to person, place, and time. She appears well-developed and well-nourished.  HENT:  Head: Normocephalic and atraumatic.  Eyes: Conjunctivae and EOM are normal.  Cardiovascular: Normal rate, regular rhythm and normal heart sounds.  No murmur heard. Pulmonary/Chest: Effort normal and breath sounds normal. No stridor. No respiratory distress. She has no wheezes.  Abdominal: Normal appearance and bowel sounds are normal. She exhibits no shifting dullness, no distension, no pulsatile liver, no fluid wave, no abdominal bruit, no ascites, no pulsatile midline mass and no mass. There is tenderness in the epigastric area. No hernia.  Neurological: She is alert and oriented to person, place, and time.  Psychiatric: She has a normal mood and affect. Her behavior is normal. Judgment and thought content normal.    Assessment and Plan Sydnee was seen today for hypertension.  Diagnoses and all orders for this visit:  Essential hypertension- discussed taking her bp meds after eating cpm  Type 2 diabetes mellitus without complication, without long-term current use of insulin (HCC) -  a1c uncontrolled Discussed freestyle libre Other orders -     Discontinue: omeprazole (PRILOSEC) 20 MG capsule; Take 1 capsule (20 mg total) by mouth daily. -     Continuous Blood Gluc Receiver (FREESTYLE LIBRE 14 DAY READER) DEVI; 1 Device by Does not apply route QID. Use to check blood glucose 3 times a day -     Continuous Blood Gluc Sensor (FREESTYLE LIBRE 14 DAY SENSOR) MISC; 1 Device by Does not apply route every 14 (fourteen) days. -     glucose blood test strip; Use as instructed     Persephone Schriever A Creta Levin

## 2018-03-06 NOTE — Patient Instructions (Addendum)
Return to clinic to discuss your doppler  Bring the glucose monitoring system    IF you received an x-ray today, you will receive an invoice from [facility]. Please contact [facility] at [phone] with questions or concerns regarding your invoice.   IF you received labwork today, you will receive an invoice from [place]. Please contact [facility] at [phone] with questions or concerns regarding your invoice.   Our billing staff will not Vella able to assist you with questions regarding bills from these companies.  You will Kaena contacted with the lab results as soon as they are available. The fastest way to get your results is to activate your My Chart account. Instructions are located on the last page of this paperwork. If you have not heard from [place] regarding the results in 2 weeks, please contact this office.    We recommend that you schedule a mammogram for breast cancer screening. Typically, you do not need a referral to do this. Please contact a local imaging center to schedule your mammogram.  [facility] - [phone]  *ask for the Radiology Department [facility]) - [phone] or [phone]  [facility] - [phone] [facility] - [phone] [facility] - [phone]  *ask for the Radiology Department [facility] - [phone]  *ask for the Radiology Department [facility] - [phone]  *ask for the Mammography Department [facility] - [phone] Glucose Test The postprandial glucose test is used to screen for diabetes. This test involves collecting a sample of your blood 2 hours after you have eaten a meal. The sample is used to determine your blood sugar (glucose) level. Your health care provider may recommend that you have this test if he or she thinks that you may have diabetes. The test can help confirm the  diagnosis. How do I prepare for this test? You will need to eat a meal that includes at least 75 milligrams (mg) of carbohydrate. The meal should include high-carbohydrate foods, such as breads, pasta, potatoes, and starchy foods. After eating this meal, you should eat nothing else until after the blood sample is taken 2 hours later. Make sure you avoid snacking during this time. Eating anything can interfere with test results. Do not smoke or exercise during the testing period. What do the results mean? It is your responsibility to obtain your test results. Ask the lab or department performing the test when and how you will get your results. Contact your health care provider to discuss any questions you have about your results. Range of Normal Values Ranges for normal values may vary among different labs and hospitals. You should always check with your health care provider after having lab work or other tests done to discuss whether your values are considered within normal limits. Normal levels of blood glucose are as follows:  32-107 years old: less than 140 mg/dL or less than 7.8 mmol/L (SI units).  36-64 years old: less than 150 mg/dL.  36 years old and older: less than 160 mg/dL.  Some situations can interfere with the results of the postprandial glucose test. These may include:  Experiencing high levels of stress around the time of the test.  Eating a snack or candy during the testing time.  Exercising during the testing time.  Being unable to eat a full, carbohydrate-rich meal before the test.  Meaning of Results Outside Normal Value Ranges Test results that are above  normal values may indicate a number of health problems, such as:  Diabetes mellitus.  Diabetes that develops during pregnancy (gestational diabetes).  Malnutrition.  Hyperthyroidism.  Acute stress response.  Cushing syndrome.  Tumors such as pheochromocytoma or glucagonoma.  Kidney  failure.  Acromegaly.  Liver disease.  Use of medicines such as diuretics or corticosteroids.  Discuss your test results with your health care provider. He or she will use the results to make a diagnosis and determine a treatment plan that is right for you. Talk with your health care provider to discuss your results, treatment options, and if necessary, the need for more tests. Talk with your health care provider if you have any questions about your results. This information is not intended to replace advice given to you by your health care provider. Make sure you discuss any questions you have with your health care provider. Document Released: 08/23/2004 Document Revised: 04/05/2016 Document Reviewed: 12/05/2013 Elsevier Interactive Patient Education  [employer]2018 Elsevier Inc.

## 2018-03-08 ENCOUNTER — Encounter: Payer: Self-pay | Admitting: Family Medicine

## 2018-03-08 ENCOUNTER — Ambulatory Visit (INDEPENDENT_AMBULATORY_CARE_PROVIDER_SITE_OTHER): Payer: Self-pay | Admitting: Family Medicine

## 2018-03-08 VITALS — BP 170/98 | HR 80 | Temp 98.6°F | Resp 17 | Ht 63.0 in | Wt 163.0 lb

## 2018-03-08 DIAGNOSIS — E1165 Type 2 diabetes mellitus with hyperglycemia: Secondary | ICD-10-CM

## 2018-03-08 DIAGNOSIS — R05 Cough: Secondary | ICD-10-CM

## 2018-03-08 DIAGNOSIS — I10 Essential (primary) hypertension: Secondary | ICD-10-CM

## 2018-03-08 DIAGNOSIS — R059 Cough, unspecified: Secondary | ICD-10-CM

## 2018-03-08 DIAGNOSIS — Z789 Other specified health status: Secondary | ICD-10-CM

## 2018-03-08 DIAGNOSIS — Z23 Encounter for immunization: Secondary | ICD-10-CM

## 2018-03-08 MED ORDER — METFORMIN HCL 500 MG PO TABS: 500.0000 mg | ORAL_TABLET | Freq: Two times a day (BID) | ORAL | 1 refills | Status: DC

## 2018-03-08 NOTE — Patient Instructions (Addendum)
For diabetes, use the blood sugar meter and bring copy of readings to visit in next 2-3 weeks. Increase metformin to twice per day for improved control for now.   Blood pressure is elevated in office today, but looked ok 2 days ago. Make sure you take the losartan hct every day.  Keep a record of your blood pressures outside of the office and if running over 140/90 at home, let me know so we can change medicine.   Ultrasound of carotid did not indicate any aneurysm or significant blockage. The shape of the blood vessel may Stephanee the cause of the fullness in the neck, but please discuss this further at cardiology appointment in August.   Continue omeprazole for heartburn in the morning, but also take Zantac at night for possible heartburn cause. I will refer you to lung specialist to discuss cough further.   Tetanus vaccine was given today.  Please schedule a physical to review other health maintenance items.  Also select primary care provider, myself or Dr. Creta LevinStallings to have one primary provider.   Cough, Adult Coughing is a reflex that clears your throat and your airways. Coughing helps to heal and protect your lungs. It is normal to cough occasionally, but a cough that happens with other symptoms or lasts a long time may Inara a sign of a condition that needs treatment. A cough may last only 2-3 weeks (acute), or it may last longer than 8 weeks (chronic). What are the causes? Coughing is commonly caused by:  Breathing in substances that irritate your lungs.  A viral or bacterial respiratory infection.  Allergies.  Asthma.  Postnasal drip.  Smoking.  Acid backing up from the stomach into the esophagus (gastroesophageal reflux).  Certain medicines.  Chronic lung problems, including COPD (or rarely, lung cancer).  Other medical conditions such as heart failure.  Follow these instructions at home: Pay attention to any changes in your symptoms. Take these actions to help with your  discomfort:  Take medicines only as told by your health care provider. ? If you were prescribed an antibiotic medicine, take it as told by your health care provider. Do not stop taking the antibiotic even if you start to feel better. ? Talk with your health care provider before you take a cough suppressant medicine.  Drink enough fluid to keep your urine clear or pale yellow.  If the air is dry, use a cold steam vaporizer or humidifier in your bedroom or your home to help loosen secretions.  Avoid anything that causes you to cough at work or at home.  If your cough is worse at night, try sleeping in a semi-upright position.  Avoid cigarette smoke. If you smoke, quit smoking. If you need help quitting, ask your health care provider.  Avoid caffeine.  Avoid alcohol.  Rest as needed.  Contact a health care provider if:  You have new symptoms.  You cough up pus.  Your cough does not get better after 2-3 weeks, or your cough gets worse.  You cannot control your cough with suppressant medicines and you are losing sleep.  You develop pain that is getting worse or pain that is not controlled with pain medicines.  You have a fever.  You have unexplained weight loss.  You have night sweats. Get help right away if:  You cough up blood.  You have difficulty breathing.  Your heartbeat is very fast. This information is not intended to replace advice given to you by your health  care provider. Make sure you discuss any questions you have with your health care provider. Document Released: 01/27/2011 Document Revised: 01/06/2016 Document Reviewed: 10/07/2014 Elsevier Interactive Patient Education  2018 [employer].   IF you received an x-ray today, you will receive an invoice from [facility]. Please contact [facility] at [phone] with questions or concerns regarding your invoice.   IF you received labwork today, you will receive an invoice from [place].  Please contact [facility] at [phone] with questions or concerns regarding your invoice.   Our billing staff will not Caroleena able to assist you with questions regarding bills from these companies.  You will Rethel contacted with the lab results as soon as they are available. The fastest way to get your results is to activate your My Chart account. Instructions are located on the last page of this paperwork. If you have not heard from [place] regarding the results in 2 weeks, please contact this office.

## 2018-03-08 NOTE — Progress Notes (Signed)
Subjective:  By signing my name below, I, Essence Howell, attest that this documentation has been prepared under the direction and in the presence of Shade Flood, MD Electronically Signed: Charline Bills, ED Scribe 03/08/2018 at 10:11 AM.   Patient ID: Madeline Bell, female    DOB: [birth year], 62 y.o.   MRN: [id number]  Chief Complaint  Patient presents with  . Cough  . Diabetes   HPI Madeline Bell is a 62 y.o. female who presents to Primary Care at [facility] for f/u. Pt's daughter is present to translate.  DM Lab Results  Component Value Date   HGBA1C 9.4 (H) 02/20/2018  Had a new diagnosis of DM earlier this month and was started on metformin 500 mg qd. Provided a monitor at visit 2 days ago. - Pt reports blood glucose readings ranging from 190s-290s. (194 yesterday at 6:30 AM, 295 last night 2 hrs after meal, 215 this morning). Denies diarrhea, side-effects from metformin.  Prominent Carotid Arteries/Neck Mass Carotid US 7/24, no significant aneurysm demonstrated. Did have tortuous R common carotid artery on the R. No significant stenosis.  Cough See last visit. Normal BNP. No acute findings on CXR last visit. Recommended Zantac and Claritin for upper airway cough. Started on omeprazole 20 mg qd by Dr. Creta Levin 2 days ago. - Pt has been taking Claritin regularly, alternating Zantac and omeprazole without improvement of dry cough. States she gets excruciating pain with coughing spells; "feels like her air-way gets stuck". Denies fever.  HTN Seen 2 days ago by Dr. Creta Levin. BP was stable at 132/72 at that time. Supposed to Corin taken losartan-HCTZ 50-12.5 mg qd. Cardiology appointment scheduled for 8/28. - Pt reports that she has been compliant with her BP meds every morning. She has a home BP machine with systolic readings ranging 142-150s. BP Readings from Last 3 Encounters:  03/08/18 (!) 170/98  03/06/18 132/72  02/23/18 (!) 152/94   Lab Results  Component Value Date   CREATININE 0.56  (L) 02/20/2018   Health Maintenance Pt plans to travel to [place] in Sept for 1 month. Her daughter states her last tetanus was more than 10 yrs ago and requests updated vaccine today. Pt does not recall prev reaction to tetanus.  Patient Active Problem List   Diagnosis Date Noted  . HTN (hypertension), benign 05/02/2014   Past Medical History:  Diagnosis Date  . Hypertension    History reviewed. No pertinent surgical history. Allergies  Allergen Reactions  . Tylenol [Acetaminophen] Rash  . Amlodipine Besylate Rash  . Lisinopril-Hydrochlorothiazide   . Bactrim [Sulfamethoxazole-Trimethoprim]     Rash   . Cephalexin   . Doxycycline     Rash and itching and lips feel burnt, no anaphylaxis  . Gentamicin Sulfate   . Penicillins   . Nsaids Rash  . Tetracyclines & Related Rash   Prior to Admission medications   Medication Sig Start Date End Date Taking? Authorizing Provider  Continuous Blood Gluc Receiver (FREESTYLE LIBRE 14 DAY READER) DEVI 1 Device by Does not apply route QID. Use to check blood glucose 3 times a day 03/06/18   Doristine Bosworth, MD  Continuous Blood Gluc Sensor (FREESTYLE LIBRE 14 DAY SENSOR) MISC 1 Device by Does not apply route every 14 (fourteen) days. 03/06/18   Collie Siad A, MD  glucose blood test strip Use as instructed 03/06/18   Collie Siad A, MD  loratadine (CLARITIN) 10 MG tablet Take 1 tablet (10 mg total) by mouth daily. 02/23/18  Shade FloodGreene, Teea Ducey R, MD  losartan-hydrochlorothiazide [facility](HYZAAR) 50-12.5 MG tablet Take 1 tablet by mouth daily. 02/20/18   Doristine BosworthStallings, Zoe A, MD  metFORMIN (GLUCOPHAGE) 500 MG tablet Take 1 tablet (500 mg total) by mouth daily with breakfast. 02/23/18   Shade FloodGreene, Paige Vanderwoude R, MD  omeprazole (PRILOSEC) 20 MG capsule Take 1 capsule (20 mg total) by mouth daily. 03/06/18   Doristine BosworthStallings, Zoe A, MD  ranitidine (ZANTAC) 150 MG tablet Take 1 tablet (150 mg total) by mouth 2 (two) times daily. 02/23/18   Shade FloodGreene, Alaney Witter R, MD   Social History     Socioeconomic History  . Marital status: Married    Spouse name: Not on file  . Number of children: Not on file  . Years of education: Not on file  . Highest education level: Not on file  Occupational History  . Not on file  Social Needs  . Financial resource strain: Not on file  . Food insecurity:    Worry: Not on file    Inability: Not on file  . Transportation needs:    Medical: Not on file    Non-medical: Not on file  Tobacco Use  . Smoking status: Never Smoker  . Smokeless tobacco: Never Used  Substance and Sexual Activity  . Alcohol use: Not on file  . Drug use: Not on file  . Sexual activity: Not on file  Lifestyle  . Physical activity:    Days per week: Not on file    Minutes per session: Not on file  . Stress: Not on file  Relationships  . Social connections:    Talks on phone: Not on file    Gets together: Not on file    Attends religious service: Not on file    Active member of club or organization: Not on file    Attends meetings of clubs or organizations: Not on file    Relationship status: Not on file  . Intimate partner violence:    Fear of current or ex partner: Not on file    Emotionally abused: Not on file    Physically abused: Not on file    Forced sexual activity: Not on file  Other Topics Concern  . Not on file  Social History Narrative  . Not on file   Review of Systems  Constitutional: Negative for fatigue, fever and unexpected weight change.  Respiratory: Positive for cough. Negative for chest tightness and shortness of breath.   Cardiovascular: Negative for chest pain, palpitations and leg swelling.  Gastrointestinal: Negative for abdominal pain, blood in stool and diarrhea.  Neurological: Negative for dizziness, syncope, light-headedness and headaches.      Objective:   Physical Exam  Constitutional: She is oriented to person, place, and time. She appears well-developed and well-nourished. No distress.  HENT:  Head: Normocephalic  and atraumatic.  Right Ear: Hearing, tympanic membrane, external ear and ear canal normal.  Left Ear: Hearing, tympanic membrane, external ear and ear canal normal.  Nose: Nose normal.  Mouth/Throat: Oropharynx is clear and moist and mucous membranes are normal. No oropharyngeal exudate.  Eyes: Pupils are equal, round, and reactive to light. Conjunctivae and EOM are normal.  Neck: Carotid bruit is not present.  Cardiovascular: Normal rate, regular rhythm, normal heart sounds and intact distal pulses.  No murmur heard. Pulmonary/Chest: Effort normal and breath sounds normal. No respiratory distress. She has no wheezes. She has no rhonchi.  Lungs are clear. No cough during visit.  Abdominal: Soft. She exhibits no pulsatile  midline mass. There is no tenderness.  Neurological: She is alert and oriented to person, place, and time.  Skin: Skin is warm and dry. No rash noted.  Psychiatric: She has a normal mood and affect. Her behavior is normal.  Vitals reviewed.  Vitals:   03/08/18 0943 03/08/18 0954  BP: (!) 182/105 (!) 170/98  Pulse: 80   Resp: 17   Temp: 98.6 F (37 C)   TempSrc: Oral   SpO2: 98%   Weight: 163 lb (73.9 kg)   Height: 5\' 3"  (1.6 m)       Assessment & Plan:    Madeline Bell is a 62 y.o. female Essential hypertension  -Variance of readings including normal when tested 2 days ago.   -Stressed daily compliance of medication, monitor home readings and if persistently elevated over 140/90, return to discuss medication changes.  ER/RTC precautions of acute worsening of symptoms  Type 2 diabetes mellitus with hyperglycemia, without long-term current use of insulin (HCC) - Plan: metFORMIN (GLUCOPHAGE) 500 MG tablet  -Rest on initial A1c and for improved control will increase metformin to 500 mg twice daily.  Bring copies of readings to next office visit within 2 to 3 weeks  Cough - Plan: Ambulatory referral to Pulmonology  -Suspected upper airway cough syndrome based on  location of symptoms.  There is no stridor or wheeze on exam,   Clearing secretions normally.  Continue omeprazole the morning, Zantac at night as possible heartburn contribution.  Refer to pulmonary for further evaluation, ER precautions if acute worsening  Need for Tdap vaccination - Plan: Tdap vaccine greater than or equal to 7yo IM given  Language barrier  -daughter translated with understanding expressed  Meds ordered this encounter  Medications  . metFORMIN (GLUCOPHAGE) 500 MG tablet    Sig: Take 1 tablet (500 mg total) by mouth 2 (two) times daily with a meal.    Dispense:  60 tablet    Refill:  1   Patient Instructions    For diabetes, use the blood sugar meter and bring copy of readings to visit in next 2-3 weeks. Increase metformin to twice per day for improved control for now.   Blood pressure is elevated in office today, but looked ok 2 days ago. Make sure you take the losartan hct every day.  Keep a record of your blood pressures outside of the office and if running over 140/90 at home, let me know so we can change medicine.   Ultrasound of carotid did not indicate any aneurysm or significant blockage. The shape of the blood vessel may Paiden the cause of the fullness in the neck, but please discuss this further at cardiology appointment in August.   Continue omeprazole for heartburn in the morning, but also take Zantac at night for possible heartburn cause. I will refer you to lung specialist to discuss cough further.   Tetanus vaccine was given today.  Please schedule a physical to review other health maintenance items.  Also select primary care provider, myself or Dr. Creta Levin to have one primary provider.   Cough, Adult Coughing is a reflex that clears your throat and your airways. Coughing helps to heal and protect your lungs. It is normal to cough occasionally, but a cough that happens with other symptoms or lasts a long time may Cameo a sign of a condition that needs  treatment. A cough may last only 2-3 weeks (acute), or it may last longer than 8 weeks (chronic). What are the causes?  Coughing is commonly caused by:  Breathing in substances that irritate your lungs.  A viral or bacterial respiratory infection.  Allergies.  Asthma.  Postnasal drip.  Smoking.  Acid backing up from the stomach into the esophagus (gastroesophageal reflux).  Certain medicines.  Chronic lung problems, including COPD (or rarely, lung cancer).  Other medical conditions such as heart failure.  Follow these instructions at home: Pay attention to any changes in your symptoms. Take these actions to help with your discomfort:  Take medicines only as told by your health care provider. ? If you were prescribed an antibiotic medicine, take it as told by your health care provider. Do not stop taking the antibiotic even if you start to feel better. ? Talk with your health care provider before you take a cough suppressant medicine.  Drink enough fluid to keep your urine clear or pale yellow.  If the air is dry, use a cold steam vaporizer or humidifier in your bedroom or your home to help loosen secretions.  Avoid anything that causes you to cough at work or at home.  If your cough is worse at night, try sleeping in a semi-upright position.  Avoid cigarette smoke. If you smoke, quit smoking. If you need help quitting, ask your health care provider.  Avoid caffeine.  Avoid alcohol.  Rest as needed.  Contact a health care provider if:  You have new symptoms.  You cough up pus.  Your cough does not get better after 2-3 weeks, or your cough gets worse.  You cannot control your cough with suppressant medicines and you are losing sleep.  You develop pain that is getting worse or pain that is not controlled with pain medicines.  You have a fever.  You have unexplained weight loss.  You have night sweats. Get help right away if:  You cough up blood.  You  have difficulty breathing.  Your heartbeat is very fast. This information is not intended to replace advice given to you by your health care provider. Make sure you discuss any questions you have with your health care provider. Document Released: 01/27/2011 Document Revised: 01/06/2016 Document Reviewed: 10/07/2014 Elsevier Interactive Patient Education  2018 [employer].   IF you received an x-ray today, you will receive an invoice from [facility]. Please contact [facility] at [phone] with questions or concerns regarding your invoice.   IF you received labwork today, you will receive an invoice from [place]. Please contact [facility] at [phone] with questions or concerns regarding your invoice.   Our billing staff will not Reia able to assist you with questions regarding bills from these companies.  You will Queena contacted with the lab results as soon as they are available. The fastest way to get your results is to activate your My Chart account. Instructions are located on the last page of this paperwork. If you have not heard from [place] regarding the results in 2 weeks, please contact this office.       I personally performed the services described in this documentation, which was scribed in my presence. The recorded information has been reviewed and considered for accuracy and completeness, addended by me as needed, and agree with information above.  Signed,   Meredith Staggers, MD Primary Care at [facility].  03/09/18 3:24 PM

## 2018-03-12 DIAGNOSIS — E119 Type 2 diabetes mellitus without complications: Secondary | ICD-10-CM | POA: Insufficient documentation

## 2018-03-12 DIAGNOSIS — R0602 Shortness of breath: Secondary | ICD-10-CM | POA: Insufficient documentation

## 2018-03-12 DIAGNOSIS — I10 Essential (primary) hypertension: Secondary | ICD-10-CM | POA: Insufficient documentation

## 2018-03-12 DIAGNOSIS — R1012 Left upper quadrant pain: Secondary | ICD-10-CM | POA: Insufficient documentation

## 2018-03-12 DIAGNOSIS — R05 Cough: Secondary | ICD-10-CM | POA: Insufficient documentation

## 2018-03-12 DIAGNOSIS — Z7984 Long term (current) use of oral hypoglycemic drugs: Secondary | ICD-10-CM | POA: Insufficient documentation

## 2018-03-12 DIAGNOSIS — R1310 Dysphagia, unspecified: Secondary | ICD-10-CM | POA: Insufficient documentation

## 2018-03-13 ENCOUNTER — Encounter (HOSPITAL_COMMUNITY): Payer: Self-pay | Admitting: Emergency Medicine

## 2018-03-13 ENCOUNTER — Encounter: Payer: Self-pay | Admitting: Gastroenterology

## 2018-03-13 ENCOUNTER — Emergency Department (HOSPITAL_COMMUNITY)
Admission: EM | Admit: 2018-03-13 | Discharge: 2018-03-13 | Disposition: A | Discharge status: Home / Self Care | Payer: Self-pay | Attending: Emergency Medicine | Admitting: Emergency Medicine

## 2018-03-13 ENCOUNTER — Other Ambulatory Visit: Payer: Self-pay

## 2018-03-13 ENCOUNTER — Telehealth: Payer: Self-pay | Admitting: Family Medicine

## 2018-03-13 DIAGNOSIS — R1310 Dysphagia, unspecified: Secondary | ICD-10-CM

## 2018-03-13 DIAGNOSIS — R053 Chronic cough: Secondary | ICD-10-CM

## 2018-03-13 DIAGNOSIS — R05 Cough: Secondary | ICD-10-CM

## 2018-03-13 HISTORY — DX: Type 2 diabetes mellitus without complications: E11.9

## 2018-03-13 LAB — URINALYSIS, ROUTINE W REFLEX MICROSCOPIC
Bilirubin Urine: NEGATIVE
GLUCOSE, UA: NEGATIVE mg/dL
Hgb urine dipstick: NEGATIVE
KETONES UR: NEGATIVE mg/dL
Leukocytes, UA: NEGATIVE
Nitrite: NEGATIVE
PROTEIN: NEGATIVE mg/dL
Specific Gravity, Urine: 1.003 — ABNORMAL LOW (ref 1.005–1.030)
pH: 6 (ref 5.0–8.0)

## 2018-03-13 NOTE — Discharge Instructions (Addendum)
We suspect that you are having difficulty in swallowing, and the next step would Madeline Bell for you to get a swallow evaluation.  Please request your primary care doctors to consider ordering an outpatient swallow evaluation study. We also think it is prudent for you to see a GI doctor.  It is my thought right now that her difficulty in swallowing is the root cause for the worsening of the cough. The difficulty in swallowing could Madeline Bell because of GI problems or because of neuromuscular disease -and she will need further evaluation in that area.

## 2018-03-13 NOTE — ED Triage Notes (Signed)
Pt arriving from home with abdominal pain. Pt states her abdomen feels tight and is distended. Pt had episode of emesis prior to arrival. Pt has had a cough for over a month.

## 2018-03-13 NOTE — Telephone Encounter (Signed)
Detailed message left for patient to make a follow up appointment with Dr. Creta LevinStallings

## 2018-03-13 NOTE — Telephone Encounter (Signed)
Copied from CRM [id number]. Topic: Quick Communication - See Telephone Encounter >> Mar 13, 2018  9:08 AM Maia Pettiesrtiz, Kristie S wrote: CRM for notification. See Telephone encounter for: 03/13/18. Pt went to ER 03/12/18 due to coughing/vomiting. ER doctor recommended to contact PCP about ordering a swallow test. Please call to advise.

## 2018-03-13 NOTE — ED Provider Notes (Signed)
[name] Provider Note   CSN: [id number] Arrival date & time: 03/12/18  2254     History   Chief Complaint Chief Complaint  Patient presents with  . Abdominal Pain    HPI Madeline Bell is a 62 y.o. female.  HPI Patient wanted her family to translate for.  62 year old female with recently diagnosed hypertension and diabetes comes in with chief complaint of abdominal discomfort, cough and difficulty in breathing.  According to family, patient has been having worsening cough over the past 1 month.  Patient's cough is worse at nighttime, when she is laying flat.  She also reports that she has difficulty in swallowing, especially solid foods which has been new.   When patient starts coughing at nighttime, she has posttussive respiratory distress.  Patient had a similar episode today which led her to panicking and calling for help.  Review of system is negative for any night sweats, weight loss.  Patient is having to use more pillows now.  She is not taking any cough medications.  Patient has seen her PCP about this, they have started on her on Zyrtec and omeprazole -presuming that there could Jamieka allergic etiology or GERD as a primary cause for the cough.  Patient denies any chest pain, lower extremity swelling.  There is no history of CAD, CHF.  No family history of cancers.  No weight loss.   Past Medical History:  Diagnosis Date  . Diabetes mellitus without complication (HCC)   . Hypertension     Patient Active Problem List   Diagnosis Date Noted  . HTN (hypertension), benign 05/02/2014    History reviewed. No pertinent surgical history.   OB History   None      Home Medications    Prior to Admission medications   Medication Sig Start Date End Date Taking? Authorizing Provider  Continuous Blood Gluc Receiver (FREESTYLE LIBRE 14 DAY READER) DEVI 1 Device by Does not apply route QID. Use to check blood glucose 3 times a day 03/06/18    Doristine Bosworth, MD  Continuous Blood Gluc Sensor (FREESTYLE LIBRE 14 DAY SENSOR) MISC 1 Device by Does not apply route every 14 (fourteen) days. 03/06/18   Collie Siad A, MD  glucose blood test strip Use as instructed 03/06/18   Collie Siad A, MD  loratadine (CLARITIN) 10 MG tablet Take 1 tablet (10 mg total) by mouth daily. 02/23/18   Shade Flood, MD  losartan-hydrochlorothiazide (HYZAAR) 50-12.5 MG tablet Take 1 tablet by mouth daily. 02/20/18   Doristine Bosworth, MD  metFORMIN (GLUCOPHAGE) 500 MG tablet Take 1 tablet (500 mg total) by mouth 2 (two) times daily with a meal. 03/08/18   Shade Flood, MD  omeprazole (PRILOSEC) 20 MG capsule Take 1 capsule (20 mg total) by mouth daily. 03/06/18   Doristine Bosworth, MD  ranitidine (ZANTAC) 150 MG tablet Take 1 tablet (150 mg total) by mouth 2 (two) times daily. 02/23/18   Shade Flood, MD    Family History Family History  Problem Relation Age of Onset  . Hypertension Father     Social History Social History   Tobacco Use  . Smoking status: Never Smoker  . Smokeless tobacco: Never Used  Substance Use Topics  . Alcohol use: Not on file  . Drug use: Not on file     Allergies   Tylenol [acetaminophen]; Amlodipine besylate; Lisinopril-hydrochlorothiazide; Bactrim [sulfamethoxazole-trimethoprim]; Cephalexin; Doxycycline; Gentamicin sulfate; Penicillins; Nsaids; and Tetracyclines & related  Review of Systems Review of Systems  Constitutional: Positive for activity change.  Respiratory: Positive for cough and shortness of breath.   Cardiovascular: Positive for chest pain.  Gastrointestinal: Positive for abdominal pain. Negative for nausea and vomiting.  Allergic/Immunologic: Negative for immunocompromised state.  All other systems reviewed and are negative.    Physical Exam Updated Vital Signs BP (!) 156/91 (BP Location: Left Arm)   Pulse 82   Temp 98.3 F (36.8 C) (Oral)   Resp 15   SpO2 95%   Physical Exam    Constitutional: She is oriented to person, place, and time. She appears well-developed.  HENT:  Head: Normocephalic and atraumatic.  Eyes: EOM are normal.  Neck: Normal range of motion. Neck supple.  Cardiovascular: Normal rate.  Pulmonary/Chest: Effort normal.  Abdominal: Soft. Normal appearance and bowel sounds are normal. There is tenderness in the left upper quadrant. There is no rigidity, no rebound and no guarding.  Neurological: She is alert and oriented to person, place, and time.  Skin: Skin is warm and dry.  Nursing note and vitals reviewed.    ED Treatments / Results  Labs (all labs ordered are listed, but only abnormal results are displayed) Labs Reviewed  URINALYSIS, ROUTINE W REFLEX MICROSCOPIC - Abnormal; Notable for the following components:      Result Value   Color, Urine STRAW (*)    Specific Gravity, Urine 1.003 (*)    All other components within normal limits    EKG None  Radiology No results found.  Procedures Procedures (including critical care time)  Medications Ordered in ED Medications - No data to display   Initial Impression / Assessment and Plan / ED Course  I have reviewed the triage vital signs and the nursing notes.  Pertinent labs & imaging results that were available during my care of the patient were reviewed by me and considered in my medical decision making (see chart for details).     62 year old female comes in with chief complaint of abdominal discomfort, cough.  Patient's cough is worse when she is laying flat.  She is also reporting that she has difficulty swallowing solids, no significant difficulty with fluids.  Patient denies any weight loss, there is no cardiac history and no chest pain.  Exam does not reveal JVD, pitting edema, rales.  X-ray from earlier this month along with labs have been reviewed, and besides elevated blood sugar, everything was unremarkable.  Patient is comfortable at this time. On exam she has  left-sided discomfort, but no peritoneal findings.  My impression is that patient is likely having difficulty in swallowing due to some mechanical reasons or neuromuscular problem. The same etiology is likely the cause for her to have some reflux problems leading to cough when she is laying flat.  Clinically there is no evidence of CHF.  I discussed my concerns with the patient's family.  We have agreed that patient should get a swallow evaluation.  I have requested them to ask PCP, if they think they would Lizbet an appropriate test for them to order, otherwise they need to follow-up with the GI doctors for their recommendations.  Since the labs were done just within the last few days along with x-ray, family does not want any unnecessary testing.  We have advised patient to use honey and over-the-counter cough medications at nighttime.  ER return precautions have been discussed.  Final Clinical Impressions(s) / ED Diagnoses   Final diagnoses:  Dysphagia, unspecified type  Chronic cough  ED Discharge Orders    None       Derwood Kaplan, MD 03/13/18 0330

## 2018-03-24 ENCOUNTER — Other Ambulatory Visit: Payer: Self-pay | Admitting: Family Medicine

## 2018-03-25 ENCOUNTER — Encounter: Payer: Self-pay | Admitting: Gastroenterology

## 2018-03-25 ENCOUNTER — Ambulatory Visit (INDEPENDENT_AMBULATORY_CARE_PROVIDER_SITE_OTHER): Payer: Self-pay | Admitting: Gastroenterology

## 2018-03-25 VITALS — BP 160/96 | HR 68 | Ht 61.0 in | Wt 162.0 lb

## 2018-03-25 DIAGNOSIS — R05 Cough: Secondary | ICD-10-CM

## 2018-03-25 DIAGNOSIS — R12 Heartburn: Secondary | ICD-10-CM

## 2018-03-25 DIAGNOSIS — R1310 Dysphagia, unspecified: Secondary | ICD-10-CM

## 2018-03-25 DIAGNOSIS — R059 Cough, unspecified: Secondary | ICD-10-CM

## 2018-03-25 DIAGNOSIS — R1030 Lower abdominal pain, unspecified: Secondary | ICD-10-CM

## 2018-03-25 MED ORDER — OMEPRAZOLE 20 MG PO CPDR: 20.0000 mg | DELAYED_RELEASE_CAPSULE | Freq: Two times a day (BID) | ORAL | 3 refills | Status: DC

## 2018-03-25 NOTE — Patient Instructions (Signed)
If you are age 465 or older, your body mass index should Jetaime between 23-30. Your Body mass index is 30.61 kg/m. If this is out of the aforementioned range listed, please consider follow up with your Primary Care Provider.  If you are age 864 or younger, your body mass index should Katherinne between 19-25. Your Body mass index is 30.61 kg/m. If this is out of the aformentioned range listed, please consider follow up with your Primary Care Provider.   You have been scheduled for an endoscopy. Please follow written instructions given to you at your visit today. If you use inhalers (even only as needed), please bring them with you on the day of your procedure. Your physician has requested that you go to www.startemmi.com and enter the access code given to you at your visit today. This web site gives a general overview about your procedure. However, you should still follow specific instructions given to you by our office regarding your preparation for the procedure.  We have sent the following medications to your pharmacy for you to pick up at your convenience: Prilosec 20 mg twice daily.   Thank you, Dr. Barron Alvineirigliano

## 2018-03-25 NOTE — Progress Notes (Signed)
Chief Complaint: Dysphagia, heartburn, cough   Referring Provider:    Derwood KaplanAnkit Nanavati, MD   HPI:    Patient is a 62 yo pleasant female presenting to the GI Clinic for evaluation of solid food dysphagia, reflux symptoms, and cough. She presents with her daughter, Ileene MusaDjin Radde, to assist with history and translation. She states that she has had progressive solid food dysphagia for 2 months, without any preceding incident or inciting event. No prior similar symptoms, and no prior endoscopy. She describes dysphagia to rice and vegetable, pointing to her anterior neck and suprasternal notch. No food impactions. She tolerates liquids and soft foods without issue. Symptoms occur 3-4 times/week. No odynophagia. No fever, chills, night sweats, adenopathy. No weight loss, early satiety, nausea, vomiting, changes in bowel habits or hematochezia/melena. She does endorse reflux sxs over the same time period, described as heartburn, regurgitation, and occasional hoarseness. No prior hx of reflux. She was prescribed Prilosec 20 mg/day and Zantac 150 mg/day. She has since stopped the Zantac, but continued the Prilosec with some clinical improvement in reflux sxs, but no appreciable change in dysphagia.   She additionally endorses a 2 month history of productive cough with associated back and abdominal pain. This was the primary complaint for her recent ER presentation on 03/13/18. Prior CXR without acute CP disease. Can have multiple coughing events throughout the day, lasting 10-30 minutes, and worse with supine. Not reliably associated with PO intake. No change in cough with trial of PPI/H2RA as above. No preceding travel. No sick contacts. Was evaluated for cough by PCM on 03/08/18, with referral placed to Pulmonology.   Finally, she states she will occasionally have lower/mid abdominal pain, which can occur in the absence of cough. No associated symptoms, and non-radiating.     Past Medical History:    Diagnosis Date  . Diabetes mellitus without complication (HCC)   . Hypertension      History reviewed. No pertinent surgical history. Family History  Problem Relation Age of Onset  . Hypertension Father    Social History   Tobacco Use  . Smoking status: Never Smoker  . Smokeless tobacco: Never Used  Substance Use Topics  . Alcohol use: Not on file  . Drug use: Not on file   Current Outpatient Medications  Medication Sig Dispense Refill  . Continuous Blood Gluc Receiver (FREESTYLE LIBRE 14 DAY READER) DEVI 1 Device by Does not apply route QID. Use to check blood glucose 3 times a day 1 Device 0  . Continuous Blood Gluc Sensor (FREESTYLE LIBRE 14 DAY SENSOR) MISC 1 Device by Does not apply route every 14 (fourteen) days. 2 each 6  . glucose blood test strip Use as instructed 100 each 12  . loratadine (CLARITIN) 10 MG tablet Take 1 tablet (10 mg total) by mouth daily. 30 tablet 1  . losartan-hydrochlorothiazide (HYZAAR) 50-12.5 MG tablet Take 1 tablet by mouth daily. 30 tablet 0  . metFORMIN (GLUCOPHAGE) 500 MG tablet Take 1 tablet (500 mg total) by mouth 2 (two) times daily with a meal. 60 tablet 1  . omeprazole (PRILOSEC) 20 MG capsule Take 1 capsule (20 mg total) by mouth daily. 30 capsule 3   No current facility-administered medications for this visit.    Allergies  Allergen Reactions  . Tylenol [Acetaminophen] Rash  . Amlodipine Besylate Rash  . Lisinopril-Hydrochlorothiazide   . Bactrim [Sulfamethoxazole-Trimethoprim]     Rash   . Cephalexin   .  Doxycycline     Rash and itching and lips feel burnt, no anaphylaxis  . Gentamicin Sulfate   . Penicillins   . Nsaids Rash  . Tetracyclines & Related Rash     Review of Systems: All systems reviewed and negative except where noted in HPI.   Creatinine clearance cannot Nandita calculated (Patient's most recent lab result is older than the maximum 21 days allowed.)   Physical Exam:    Wt Readings from Last 3 Encounters:   03/25/18 162 lb (73.5 kg)  03/08/18 163 lb (73.9 kg)  03/06/18 161 lb 9.6 oz (73.3 kg)    BP (!) 160/96   Pulse 68   Ht 5\' 1"  (1.549 m) Comment: height taken without shoes  Wt 162 lb (73.5 kg)   SpO2 94%   BMI 30.61 kg/m  Constitutional:  Pleasant female in no acute distress. Psychiatric: Normal mood and affect. Behavior is normal. EENT: Pupils normal.  Conjunctivae are normal. No scleral icterus. Neck supple.  Cardiovascular: Normal rate, regular rhythm. No edema Pulmonary/chest: Effort normal and breath sounds normal. No wheezing, rales or rhonchi. Abdominal: Soft, nondistended, nontender. Bowel sounds active throughout. There are no masses palpable. No hepatomegaly. Neurological: Alert and oriented to person place and time. Skin: Skin is warm and dry. No rashes noted.    ASSESSMENT AND PLAN;  1) Dysphagia: Progressive solid food dysphagia in the setting of potentially new reflux symptoms and cough. Discussed constellation of sxs at length with patient and her daughter, and will plan on evaluation and tx as below - Increase PPI to Prilosec 20 mg PO BID for 4 week trial - EGD for diagnostic and potentially therapeutic intent with dilation as indicated  2) Reflux Heartburn, regurgitation and cough, hoarseness potentially atypical manifestations of reflux.  - EGD to assess this 47>62 yo female with new onset reflux/dysphagia. Assess for reflux esophagitis, hiatal hernia, etc - Increase acid suppression as above - Avoid inciting foods - Sleep with HOB elevated - Avoid eating within 2-3 hours of bedtime  3) Cough - Will assess for reflux as above for potential cause (ie, LPR) - Already has referral to Pulmonology  4) Abdominal pain - Will assess for intraluminal etiology with EGD as above - Biopsies during EGD to assess for H pylori  The indications, risks, and benefits of EGD were explained to the patient and her daughter in detail. Risks include but are not limited to  bleeding, perforation, adverse reaction to medications, and cardiopulmonary compromise. Sequelae include but are not limited to the possibility of surgery, hositalization, and mortality. The patient verbalized understanding and wished to proceed. All questions answered, referred to scheduler. Further recommendations pending results of the exam.      Doristine LocksVito Cirigliano, DO, [facility]

## 2018-03-26 ENCOUNTER — Encounter: Payer: Self-pay | Admitting: Family Medicine

## 2018-03-26 ENCOUNTER — Ambulatory Visit (AMBULATORY_SURGERY_CENTER): Payer: Self-pay | Admitting: Gastroenterology

## 2018-03-26 ENCOUNTER — Ambulatory Visit: Payer: Self-pay | Admitting: Family Medicine

## 2018-03-26 ENCOUNTER — Encounter: Payer: Self-pay | Admitting: Gastroenterology

## 2018-03-26 VITALS — BP 133/80 | HR 75 | Temp 99.6°F | Resp 18 | Ht 61.0 in | Wt 162.0 lb

## 2018-03-26 DIAGNOSIS — K449 Diaphragmatic hernia without obstruction or gangrene: Secondary | ICD-10-CM | POA: Insufficient documentation

## 2018-03-26 DIAGNOSIS — K2990 Gastroduodenitis, unspecified, without bleeding: Secondary | ICD-10-CM

## 2018-03-26 DIAGNOSIS — K2970 Gastritis, unspecified, without bleeding: Secondary | ICD-10-CM | POA: Insufficient documentation

## 2018-03-26 DIAGNOSIS — K2100 Gastro-esophageal reflux disease with esophagitis, without bleeding: Secondary | ICD-10-CM

## 2018-03-26 DIAGNOSIS — R1310 Dysphagia, unspecified: Secondary | ICD-10-CM

## 2018-03-26 HISTORY — PX: UPPER GASTROINTESTINAL ENDOSCOPY: SHX188

## 2018-03-26 HISTORY — DX: Diaphragmatic hernia without obstruction or gangrene: K44.9

## 2018-03-26 MED ORDER — SODIUM CHLORIDE 0.9 % IV SOLN: 500.0000 mL | Freq: Once | INTRAVENOUS | Status: DC

## 2018-03-26 NOTE — Progress Notes (Signed)
Called to room to assist during endoscopic procedure.  Patient ID and intended procedure confirmed with present staff. Received instructions for my participation in the procedure from the performing physician.  

## 2018-03-26 NOTE — Patient Instructions (Addendum)
**   Handouts given on gastritis, Hiatal hernia, GERD, esophagitis. **  Take Omeprazole twice daily for 8 weeks, then decrease to lowest effective dose. **   YOU HAD AN ENDOSCOPIC PROCEDURE TODAY AT THE [facility]:   Refer to the procedure report that was given to you for any specific questions about what was found during the examination.  If the procedure report does not answer your questions, please call your gastroenterologist to clarify.  If you requested that your care partner not Madeline Bell given the details of your procedure findings, then the procedure report has been included in a sealed envelope for you to review at your convenience later.  YOU SHOULD EXPECT: Some feelings of bloating in the abdomen. Passage of more gas than usual.  Walking can help get rid of the air that was put into your GI tract during the procedure and reduce the bloating. If you had a lower endoscopy (such as a colonoscopy or flexible sigmoidoscopy) you may notice spotting of blood in your stool or on the toilet paper. If you underwent a bowel prep for your procedure, you may not have a normal bowel movement for a few days.  Please Note:  You might notice some irritation and congestion in your nose or some drainage.  This is from the oxygen used during your procedure.  There is no need for concern and it should clear up in a day or so.  SYMPTOMS TO REPORT IMMEDIATELY:   Following upper endoscopy (EGD)  Vomiting of blood or coffee ground material  New chest pain or pain under the shoulder blades  Painful or persistently difficult swallowing  New shortness of breath  Fever of 100F or higher  Black, tarry-looking stools  For urgent or emergent issues, a gastroenterologist can Madeline Bell reached at any hour by calling [phone].   DIET:  We do recommend a small meal at first, but then you may proceed to your regular diet.  Drink plenty of fluids but you should avoid alcoholic beverages for 24 hours.  ACTIVITY:   You should plan to take it easy for the rest of today and you should NOT DRIVE or use heavy machinery until tomorrow (because of the sedation medicines used during the test).    FOLLOW UP: Our staff will call the number listed on your records the next business day following your procedure to check on you and address any questions or concerns that you may have regarding the information given to you following your procedure. If we do not reach you, we will leave a message.  However, if you are feeling well and you are not experiencing any problems, there is no need to return our call.  We will assume that you have returned to your regular daily activities without incident.  If any biopsies were taken you will Madeline Bell contacted by phone or by letter within the next 1-3 weeks.  Please call Madeline Bell at [phone] if you have not heard about the biopsies in 3 weeks.    SIGNATURES/CONFIDENTIALITY: You and/or your care partner have signed paperwork which will Madeline Bell entered into your electronic medical record.  These signatures attest to the fact that that the information above on your After Visit Summary has been reviewed and is understood.  Full responsibility of the confidentiality of this discharge information lies with you and/or your care-partner.

## 2018-03-26 NOTE — Op Note (Signed)
[facility] Patient Name: Madeline Bell Procedure Date: 03/26/2018 9:44 AM MRN: [id number] Endoscopist: Doristine LocksVito Assunta Pupo , MD Age: [age] Referring MD:  Date of Birth: 11/06/1955 Gender: Female Account #: [id number] Procedure:                Upper GI endoscopy Indications:              Dysphagia, Heartburn, Chronic cough                           62 yo female with recent onset solid food dysphagia                            along with reflux symptoms and chronic daily cough. Medicines:                Monitored Anesthesia Care Procedure:                Pre-Anesthesia Assessment:                           - Prior to the procedure, a History and Physical                            was performed, and patient medications and                            allergies were reviewed. The patient's tolerance of                            previous anesthesia was also reviewed. The risks                            and benefits of the procedure and the sedation                            options and risks were discussed with the patient.                            All questions were answered, and informed consent                            was obtained. Prior Anticoagulants: The patient has                            taken no previous anticoagulant or antiplatelet                            agents. ASA Grade Assessment: II - A patient with                            mild systemic disease. After reviewing the risks                            and benefits, the patient was deemed in  satisfactory condition to undergo the procedure.                           After obtaining informed consent, the endoscope was                            passed under direct vision. Throughout the                            procedure, the patient's blood pressure, pulse, and                            oxygen saturations were monitored continuously. The                            Endoscope was  introduced through the mouth, and                            advanced to the second part of duodenum. The upper                            GI endoscopy was accomplished without difficulty.                            The patient tolerated the procedure well. Scope In: Scope Out: Findings:                 LA Grade A (one or more mucosal breaks less than 5                            mm, not extending between tops of 2 mucosal folds)                            esophagitis with no bleeding was found 39 cm from                            the incisors.                           Esophagogastric landmarks were identified: the                            Z-line was found at 35 cm, the gastroesophageal                            junction was found at 35 cm and the site of hiatal                            narrowing was found at 39 cm from the incisors.                           A 4 cm hiatal hernia was present.  The upper third of the esophagus and middle third                            of the esophagus were normal. A guidewire was                            placed and the scope was withdrawn. Dilation was                            performed with a Savary dilator with mild                            resistance at 48 Fr. The dilation site was examined                            following endoscope reinsertion and showed no                            bleeding, mucosal tear or perforation.                           The gastroesophageal flap valve was visualized                            endoscopically and classified as Hill Grade IV (no                            fold, wide open lumen, hiatal hernia present).                           Scattered mild inflammation characterized by                            erythema was found in the cardia, in the gastric                            fundus and in the gastric antrum. Biopsies were                            taken with a cold  forceps for Helicobacter pylori                            testing. Estimated blood loss was minimal.                           Localized mildly erythematous mucosa without active                            bleeding and with no stigmata of bleeding was found                            in the duodenal bulb. Biopsies were taken with a  cold forceps for histology. Estimated blood loss                            was minimal.                           The second portion of the duodenum was normal.                            Biopsies were taken with a cold forceps for                            histology. Estimated blood loss was minimal. Complications:            No immediate complications. Estimated blood loss:                            Minimal. Estimated Blood Loss:     Estimated blood loss was minimal. Impression:               - LA Grade A reflux esophagitis.                           - Esophagogastric landmarks identified as above.                           - 4 cm hiatal hernia.                           - Normal upper third of esophagus and middle third                            of esophagus. Dilated.                           - Gastroesophageal flap valve classified as Hill                            Grade IV (no fold, wide open lumen, hiatal hernia                            present).                           - Gastritis. Biopsied.                           - Erythematous duodenopathy. Biopsied.                           - Normal second portion of the duodenum. Biopsied. Recommendation:           - Patient has a contact number available for                            emergencies. The signs and symptoms of potential  delayed complications were discussed with the                            patient. Return to normal activities tomorrow.                            Written discharge instructions were provided to the                             patient.                           - Resume previous diet today.                           - Continue present medications.                           - Await pathology results.                           - Will discuss the indications for repeat upper                            endoscopy in 8 weeks as indicated to check for                            mucosal healing.                           - Use Prilosec (omeprazole) 20 mg PO BID for 8                            weeks, then decrease to the lowest effective dose. Doristine Locks, MD 03/26/2018 10:16:23 AM

## 2018-03-26 NOTE — Progress Notes (Signed)
Spontaneous respirations throughout. VSS. Resting comfortably. To PACU on room air. Report to  RN. 

## 2018-03-26 NOTE — Progress Notes (Addendum)
Pt has a diabetic device, Free style Libre 14 day sensor, in her left upper arm that can Ebany scanned to give blood glucose levels.  Pt's daughter is her interpreter today, Djin Buer and she speaks english well. maw

## 2018-03-27 ENCOUNTER — Telehealth: Payer: Self-pay

## 2018-03-27 NOTE — Telephone Encounter (Signed)
  Follow up Call-  Call back number 03/26/2018  Post procedure Call Back phone  # Daughter Ileene Musa- Djin Rowles #[phone] cell speaks english  Permission to leave phone message Yes  Some recent data might Kajal hidden     Patient questions:  Do you have a fever, pain , or abdominal swelling? No. Pain Score  0 *  Have you tolerated food without any problems? Yes.    Have you been able to return to your normal activities? Yes.    Do you have any questions about your discharge instructions: Diet   No. Medications  No. Follow up visit  No.  Do you have questions or concerns about your Care? No.  Actions: * If pain score is 4 or above: No action needed, pain <4.

## 2018-03-28 ENCOUNTER — Telehealth: Payer: Self-pay

## 2018-03-28 NOTE — Telephone Encounter (Signed)
Added 2 month recall per verbal order of Dr Barron Alvineirigliano.

## 2018-04-08 ENCOUNTER — Telehealth: Payer: Self-pay

## 2018-04-08 NOTE — Telephone Encounter (Signed)
Called daughter to make sure they were aware that biopsies were negative.

## 2018-04-08 NOTE — Telephone Encounter (Signed)
-----   Message from Emerald LakesVito Cirigliano V, DO sent at 03/29/2018  4:40 PM EDT ----- Regarding: Biopsy results Pam, Can you please inform this patient that her biopsies from her small intestine and stomach were both normal.  No evidence of villous atrophy (i.e. celiac) or H. pylori gastritis.  She can follow-up with me in clinic in approximately 8 weeks.  Thank you  ----- Message ----- From: Interface, Lab In [facility] Sent: 03/28/2018   4:49 PM EDT To: Shellia CleverlyVito Cirigliano V, DO

## 2018-04-10 ENCOUNTER — Encounter: Payer: Self-pay | Admitting: Family Medicine

## 2018-04-10 ENCOUNTER — Ambulatory Visit (INDEPENDENT_AMBULATORY_CARE_PROVIDER_SITE_OTHER): Payer: Self-pay | Admitting: Family Medicine

## 2018-04-10 ENCOUNTER — Ambulatory Visit: Payer: Self-pay | Admitting: Cardiology

## 2018-04-10 ENCOUNTER — Other Ambulatory Visit: Payer: Self-pay

## 2018-04-10 VITALS — BP 134/80 | HR 89 | Temp 97.6°F | Ht 62.0 in | Wt 163.6 lb

## 2018-04-10 DIAGNOSIS — K2900 Acute gastritis without bleeding: Secondary | ICD-10-CM

## 2018-04-10 DIAGNOSIS — E1165 Type 2 diabetes mellitus with hyperglycemia: Secondary | ICD-10-CM

## 2018-04-10 DIAGNOSIS — Z1211 Encounter for screening for malignant neoplasm of colon: Secondary | ICD-10-CM

## 2018-04-10 DIAGNOSIS — I10 Essential (primary) hypertension: Secondary | ICD-10-CM

## 2018-04-10 LAB — POCT URINALYSIS DIP (MANUAL ENTRY)
BILIRUBIN UA: NEGATIVE
BILIRUBIN UA: NEGATIVE mg/dL
GLUCOSE UA: NEGATIVE mg/dL
NITRITE UA: NEGATIVE
Protein Ur, POC: NEGATIVE mg/dL
RBC UA: NEGATIVE
Spec Grav, UA: <=1.005 — AB (ref 1.010–1.025)
Urobilinogen, UA: 0.2 E.U./dL
pH, UA: 6 (ref 5.0–8.0)

## 2018-04-10 LAB — POCT GLYCOSYLATED HEMOGLOBIN (HGB A1C): Hemoglobin A1C: 9.3 % — AB (ref 4.0–5.6)

## 2018-04-10 MED ORDER — METFORMIN HCL 1000 MG PO TABS: 1000.0000 mg | ORAL_TABLET | Freq: Two times a day (BID) | ORAL | 3 refills | Status: DC

## 2018-04-10 NOTE — Progress Notes (Signed)
Chief Complaint  Patient presents with  . Hypertension  . Diabetes    f/u - today sugar is about up   . Cough    still ongoing    HPI  Djin Reznick is her daughter and is her translator  Pt had EGD that showed hiatal hernia as well as gastritis She continues to have "excruciating abdominal pain" and is on prilosec 20mg  bid She is eating less spicy foods and avoid grease She has normal bm but occasional constipation No vomiting or belching  Hypertension: Patient here for follow-up of elevated blood pressure. She is not exercising and is adherent to low salt diet.  Blood pressure is well controlled at home. Cardiac symptoms none. Patient denies chest pressure/discomfort, claudication, exertional chest pressure/discomfort, fatigue, irregular heart beat, near-syncope and orthopnea.     Diabetes Mellitus: Patient presents for follow up of diabetes. her sugars are improving She had pumpkin soup last night and the sugars were high this morning No hypoglycemia She has been having difficulty passing urine. When asked how she urinates she states that she pushes the urine out. No dysuria or hematuria. Reviewing her freestyle libre 14 day shows that her average glucose is 183 in the past week but her range is from 127-380 She stills has a very high fasting glucose most days  She eats eggplant soup, chicken, bread  Wt Readings from Last 3 Encounters:  04/10/18 163 lb 9.6 oz (74.2 kg)  03/26/18 162 lb (73.5 kg)  03/25/18 162 lb (73.5 kg)   Colon Cancer Screening SHe has never had a colonoscopy SHe denies blood in her stool, unexpected weight loss or pain with defecation No rectal itching SHe does not smoke SHe does not have a family history of colon cancer    Past Medical History:  Diagnosis Date  . Diabetes mellitus without complication (HCC)   . Dysphagia    esophageal dysphagia  . GERD (gastroesophageal reflux disease)   . Hiatal hernia 03/26/2018   EGD 03/26/18 shows gastritis  and hiatal hernia 4cm  . Hypertension   . Sickle cell anemia (HCC)    trait    Current Outpatient Medications  Medication Sig Dispense Refill  . Continuous Blood Gluc Receiver (FREESTYLE LIBRE 14 DAY READER) DEVI 1 Device by Does not apply route QID. Use to check blood glucose 3 times a day 1 Device 0  . Continuous Blood Gluc Sensor (FREESTYLE LIBRE 14 DAY SENSOR) MISC 1 Device by Does not apply route every 14 (fourteen) days. 2 each 6  . glucose blood test strip Use as instructed 100 each 12  . loratadine (CLARITIN) 10 MG tablet Take 1 tablet (10 mg total) by mouth daily. 30 tablet 1  . losartan-hydrochlorothiazide (HYZAAR) 50-12.5 MG tablet TAKE 1 TABLET BY MOUTH EVERY DAY 30 tablet 0  . metFORMIN (GLUCOPHAGE) 1000 MG tablet Take 1 tablet (1,000 mg total) by mouth 2 (two) times daily with a meal. 60 tablet 3  . omeprazole (PRILOSEC) 20 MG capsule Take 1 capsule (20 mg total) by mouth 2 (two) times daily. 30 capsule 3   No current facility-administered medications for this visit.     Allergies:  Allergies  Allergen Reactions  . Tylenol [Acetaminophen] Rash  . Amlodipine Besylate Rash  . Lisinopril-Hydrochlorothiazide   . Bactrim [Sulfamethoxazole-Trimethoprim]     Rash   . Cephalexin   . Doxycycline     Rash and itching and lips feel burnt, no anaphylaxis  . Gentamicin Sulfate   . Penicillins   .  Nsaids Rash  . Tetracyclines & Related Rash    No past surgical history on file.  Social History   Socioeconomic History  . Marital status: Married    Spouse name: Not on file  . Number of children: Not on file  . Years of education: Not on file  . Highest education level: Not on file  Occupational History  . Not on file  Social Needs  . Financial resource strain: Not on file  . Food insecurity:    Worry: Not on file    Inability: Not on file  . Transportation needs:    Medical: Not on file    Non-medical: Not on file  Tobacco Use  . Smoking status: Never Smoker  .  Smokeless tobacco: Never Used  Substance and Sexual Activity  . Alcohol use: Not Currently  . Drug use: Never  . Sexual activity: Not on file  Lifestyle  . Physical activity:    Days per week: Not on file    Minutes per session: Not on file  . Stress: Not on file  Relationships  . Social connections:    Talks on phone: Not on file    Gets together: Not on file    Attends religious service: Not on file    Active member of club or organization: Not on file    Attends meetings of clubs or organizations: Not on file    Relationship status: Not on file  Other Topics Concern  . Not on file  Social History Narrative  . Not on file    Family History  Problem Relation Age of Onset  . Hypertension Father   . Colon cancer Neg Hx   . Esophageal cancer Neg Hx   . Rectal cancer Neg Hx   . Stomach cancer Neg Hx      ROS Review of Systems See HPI Constitution: No fevers or chills No malaise No diaphoresis Skin: No rash or itching Eyes: no blurry vision, no double vision GU: no dysuria or hematuria Neuro: no dizziness or headaches all others reviewed and negative   Objective: Vitals:   04/10/18 0810  BP: 134/80  Pulse: 89  Temp: 97.6 F (36.4 C)  TempSrc: Oral  SpO2: 95%  Weight: 163 lb 9.6 oz (74.2 kg)  Height: 5\' 2"  (1.575 m)    Physical Exam  Constitutional: She is oriented to person, place, and time. She appears well-developed and well-nourished.  HENT:  Head: Normocephalic and atraumatic.  Eyes: Conjunctivae and EOM are normal.  Neck: Normal range of motion. Neck supple.  Cardiovascular: Normal rate, regular rhythm and normal heart sounds. Exam reveals no friction rub.  No murmur heard. Pulmonary/Chest: Effort normal and breath sounds normal. No stridor. No respiratory distress. She has no wheezes.  Abdominal: Soft. Bowel sounds are normal. She exhibits no distension and no mass. There is no tenderness. There is no guarding.  Neurological: She is alert and  oriented to person, place, and time.  Skin: Skin is warm. Capillary refill takes less than 2 seconds.  Psychiatric: She has a normal mood and affect. Her behavior is normal. Judgment and thought content normal.    Assessment and Plan Madeline Bell was seen today for hypertension, diabetes and cough.  Diagnoses and all orders for this visit:  Type 2 diabetes mellitus with hyperglycemia, without long-term current use of insulin (HCC)- reviewed glucose levels Increased metformin Since metformin has GI side effects at high doses advised tums in addition to the prilosec  -  POCT urinalysis dipstick -     POCT glycosylated hemoglobin (Hb A1C) -     metFORMIN (GLUCOPHAGE) 1000 MG tablet; Take 1 tablet (1,000 mg total) by mouth 2 (two) times daily with a meal.  Screening for colon cancer-  -     Ambulatory referral to Gastroenterology  Essential hypertension- bp at goal, cpm  Acute superficial gastritis without hemorrhage- discussed her EGD Continue omeprazole She was also advised to take tums as well for breakthrough pain     Makael Stein A Creta Levin

## 2018-04-10 NOTE — Patient Instructions (Addendum)
Add tums as needed for reflux, belching or stomach pains tums can cause constipation so if that occurs then take something like prune juice For constipation for more than 5 days take magnesium citrate half a bottle over the counter    If you have lab work done today you will Zenab contacted with your lab results within the next 2 weeks.  If you have not heard from [place] then please contact us. The fastest way to get your results is to register for My Chart.   IF you received an x-ray today, you will receive an invoice from [facility]. Please contact [facility] at [phone] with questions or concerns regarding your invoice.   IF you received labwork today, you will receive an invoice from [facility]. Please contact [facility] at [phone] with questions or concerns regarding your invoice.   Our billing staff will not Shriya able to assist you with questions regarding bills from these companies.  You will Shammara contacted with the lab results as soon as they are available. The fastest way to get your results is to activate your My Chart account. Instructions are located on the last page of this paperwork. If you have not heard from [place] regarding the results in 2 weeks, please contact this office.     Low Glycemic Foods (20-49)  Breakfast Cereals: All-Bran                All-Bran Fruit ' n Oats Fiber One               Oatmeal (not instant)  Oat bran Fruits and fruit juices: (Limit to 1-2 servings per day) Apples               Apricots (fresh & dried)  Blackberries            Blueberries Cherries                  Cranberries             Peaches                  Pears                       Plums                       Prunes Grapefruit                Raspberries            Strawberries           Tangerine Apple juice             Grapefruit juice Tomato juice  Beans and legumes (fresh-cooked): Black-eyed peas     Butter beans Chick peas              Lentils     Green beans            Lima beans               Kidney beans          Navy beans  Non-starchy vegetables: Asparagus, bok choy, broccoli, cabbage, cauliflower, celery, cucumber, greens, lettuce, mushrooms, peppers, tomatoes, okra, onions, snow peas, spinach, summer squash  Grains: Barley  Bulgur Rye                                    Wild rice  Nuts and oils : Almonds         Peanuts     Sunflower seeds  Hazelnuts      Pecans          Walnuts Oils that are liquid at room temperature  Dairy, fish, and meat: Milk, skim                         Lowfat cheese Yogurt, lowfat, fruit sugar sweetened Lean red meat                      Fish  Skinless chicken & [place]    Shellfish Moderate Glycemic Foods (50-69)  Breakfast Cereals: Bran Buds                             Bran Chex Just Right                            Mini-Wheats Special K         Swiss muesli  Fruits: Banana (under-ripe)             Dates Figs                                      Grapes Kiwi                                      Mango Oranges                               Raisins  Fruit Juices: Cranberry juice                    Orange juice  Beans and legumes: [place]-type baked beans Canned pinto, kidney, or navy beans Green peas  Vegetables: Beets                         Raw Carrots  Sweet potato              Yam Corn on the cob  Breads: Pita (pocket) bread          Oat bran bread Pumpernickel bread           Rye bread Wheat bread, high fiber       Grains: Cornmeal                           Rice, brown   Rice, white                         Couscous Pasta: Macaroni                           Pizza  cheese Raviolimeat filled           Spaghetti,  white        Nuts: Cashews                           Macadamia  Snacks: Chocolate                    Ice cream,lowfat  Muffin                               Popcorn High Glycemic Foods (70-100)   Breakfast Cereals: Cheerios                  Corn Chex Corn Flakes            Cream of Wheat Grape Nuts              Grape Nut Flakes Life                 Nutri-Grain       Puffed Rice               Puffed Wheat Rice Chex                 Rice Krispies Shredded Wheat             Team Total Fruits: Pineapple                 Watermelon Banana (over-ripe)  Beverages: Sodas, sweet tea, pineapple juice  Vegetables: Potato, baked, boiled, fried, mashed Jamaica[place] fries Canned or frozen corn Cooked carrots Parsnips Winter squash  Breads: Most breads (white and whole grain) Bagels                     Bread sticks Bread stuffing          Kaiser roll Dinner rolls  Grains: Rice, instant          Tapioca, with milk  Candy and most cookies Snacks: Donuts                      Corn chips        Jelly beans                 Pretzels Pastries                             Restaurant and ethnic foods Most Congohinese food (sugar in stir fry or wok sauces) Teriyaki-style meats and vegetables

## 2018-04-23 ENCOUNTER — Encounter: Payer: Self-pay | Admitting: Gastroenterology

## 2018-04-24 ENCOUNTER — Other Ambulatory Visit: Payer: Self-pay | Admitting: Family Medicine

## 2018-04-27 ENCOUNTER — Other Ambulatory Visit: Payer: Self-pay | Admitting: Family Medicine

## 2018-04-27 DIAGNOSIS — R05 Cough: Secondary | ICD-10-CM

## 2018-04-27 DIAGNOSIS — R059 Cough, unspecified: Secondary | ICD-10-CM

## 2018-04-29 NOTE — Telephone Encounter (Signed)
Loratadine 10 mg refill Last Refill:02/23/18 # 30 and 1 refill Last OV: 04/10/18 PCP: Laurence AlyZ. Stallings Pharmacy:[facility] # [phone]

## 2018-05-28 ENCOUNTER — Other Ambulatory Visit: Payer: Self-pay

## 2018-05-28 ENCOUNTER — Encounter (HOSPITAL_COMMUNITY): Payer: Self-pay

## 2018-05-28 ENCOUNTER — Ambulatory Visit (INDEPENDENT_AMBULATORY_CARE_PROVIDER_SITE_OTHER): Payer: Self-pay

## 2018-05-28 ENCOUNTER — Ambulatory Visit (HOSPITAL_COMMUNITY)
Admission: EM | Admit: 2018-05-28 | Discharge: 2018-05-28 | Disposition: A | Discharge status: Home / Self Care | Payer: Self-pay | Attending: Family Medicine | Admitting: Family Medicine

## 2018-05-28 DIAGNOSIS — R0989 Other specified symptoms and signs involving the circulatory and respiratory systems: Secondary | ICD-10-CM

## 2018-05-28 DIAGNOSIS — R059 Cough, unspecified: Secondary | ICD-10-CM

## 2018-05-28 DIAGNOSIS — R05 Cough: Secondary | ICD-10-CM

## 2018-05-28 MED ORDER — BUMETANIDE 1 MG PO TABS: 1.0000 mg | ORAL_TABLET | Freq: Every day | ORAL | 0 refills | Status: DC

## 2018-05-28 NOTE — ED Triage Notes (Signed)
Pt state daughter states her mother has a deep cough that causes the pt pain in her abdominal area. X 2 months

## 2018-05-28 NOTE — ED Notes (Signed)
Bed: UC01 Expected date:  Expected time:  Means of arrival:  Comments: APPT 

## 2018-05-28 NOTE — Discharge Instructions (Signed)
Please take bumex 1 tablet for the next 3-4 days Follow up with Primary care Continue reflux medicine, claritin Warm compresses to abdomen as this may Exa muscular strain from coughing  Follow up if cough worsening, developing fever, chest pain, shortness of breath, weakness

## 2018-05-29 NOTE — ED Provider Notes (Signed)
[facility]    CSN: [id number] Arrival date & time: 05/28/18  0944     History   Chief Complaint Chief Complaint  Patient presents with  . Appointment    10 am  . Cough    HPI Daughter present and translating for patient Madeline Bell is a 62 y.o. female history of DM type II, hypertension, hiatal hernia presenting today for evaluation of a cough and abdominal pain.  Patient has had a cough for the past 3 to 4 months since June.  She has had a persistent dry cough.  Does not have associated rhinorrhea or sore throat.  Denies associated fevers.  When patient coughs she has a sharp stabbing pain into her lower abdomen.  Denies this pain without cough, pain resolves after completion of the cough.  Denies shortness of breath or chest pain.  Denies nausea or vomiting.  She has had some sensations of food getting stuck in her throat. She is been evaluated multiple times for this.  She has been seen by her PCP as well as gastroenterology and in the emergency room.  Patient has had an endoscopy with gastroenterology-showing gastritis and hiatal hernia, she has been using Prilosec daily, this is not helped her symptoms.  She is also a been taking Claritin daily.  She has plans to have a colonoscopy.  She has not taken any antibiotics or used any cough medicine.  She has had previous chest x-ray that was normal.  Last chest x-ray was in July.  Denies change in bowel movements, occasionally has constipation and has to strain.  Denies urinary symptoms.  Occasionally will have lower leg pain and swelling, denies this at this time.  Denies previous DVT/PE.  Denies recent travel, immobilization or surgeries.  HPI  Past Medical History:  Diagnosis Date  . Diabetes mellitus without complication (HCC)   . Dysphagia    esophageal dysphagia  . GERD (gastroesophageal reflux disease)   . Hiatal hernia 03/26/2018   EGD 03/26/18 shows gastritis and hiatal hernia 4cm  . Hypertension   . Sickle cell  anemia (HCC)    trait    Patient Active Problem List   Diagnosis Date Noted  . Hiatal hernia 03/26/2018  . Gastritis 03/26/2018  . HTN (hypertension), benign 05/02/2014    History reviewed. No pertinent surgical history.  OB History   None      Home Medications    Prior to Admission medications   Medication Sig Start Date End Date Taking? Authorizing Provider  bumetanide (BUMEX) 1 MG tablet Take 1 tablet (1 mg total) by mouth daily for 4 days. 05/28/18 06/01/18  Davonne Baby C, PA-C  Continuous Blood Gluc Receiver (FREESTYLE LIBRE 14 DAY READER) DEVI 1 Device by Does not apply route QID. Use to check blood glucose 3 times a day 03/06/18   Doristine Bosworth, MD  Continuous Blood Gluc Sensor (FREESTYLE LIBRE 14 DAY SENSOR) MISC 1 Device by Does not apply route every 14 (fourteen) days. 03/06/18   Collie Siad A, MD  glucose blood test strip Use as instructed 03/06/18   Collie Siad A, MD  loratadine (CLARITIN) 10 MG tablet TAKE 1 TABLET BY MOUTH EVERY DAY 04/29/18   Doristine Bosworth, MD  losartan-hydrochlorothiazide (HYZAAR) 50-12.5 MG tablet TAKE 1 TABLET BY MOUTH EVERY DAY 04/24/18   Doristine Bosworth, MD  metFORMIN (GLUCOPHAGE) 1000 MG tablet Take 1 tablet (1,000 mg total) by mouth 2 (two) times daily with a meal. 04/10/18   Creta Levin,  Zoe A, MD  omeprazole (PRILOSEC) 20 MG capsule Take 1 capsule (20 mg total) by mouth 2 (two) times daily. 03/25/18   Cirigliano, Verlin Dike, DO    Family History Family History  Problem Relation Age of Onset  . Hypertension Father   . Colon cancer Neg Hx   . Esophageal cancer Neg Hx   . Rectal cancer Neg Hx   . Stomach cancer Neg Hx     Social History Social History   Tobacco Use  . Smoking status: Never Smoker  . Smokeless tobacco: Never Used  Substance Use Topics  . Alcohol use: Not Currently  . Drug use: Never     Allergies   Tylenol [acetaminophen]; Amlodipine besylate; Lisinopril-hydrochlorothiazide; Bactrim  [sulfamethoxazole-trimethoprim]; Cephalexin; Doxycycline; Gentamicin sulfate; Penicillins; Nsaids; and Tetracyclines & related   Review of Systems Review of Systems  Constitutional: Negative for activity change, appetite change, chills, fatigue and fever.  HENT: Negative for congestion, ear pain, rhinorrhea, sinus pressure, sore throat and trouble swallowing.   Eyes: Negative for discharge and redness.  Respiratory: Positive for cough. Negative for chest tightness and shortness of breath.   Cardiovascular: Negative for chest pain.  Gastrointestinal: Positive for abdominal pain. Negative for diarrhea, nausea and vomiting.  Genitourinary: Negative for dysuria and frequency.  Musculoskeletal: Negative for myalgias.  Skin: Negative for rash.  Neurological: Negative for dizziness, light-headedness and headaches.     Physical Exam Triage Vital Signs ED Triage Vitals  Enc Vitals Group     BP 05/28/18 1011 139/84     Pulse Rate 05/28/18 1011 80     Resp 05/28/18 1011 18     Temp 05/28/18 1011 98.3 F (36.8 C)     Temp src --      SpO2 05/28/18 1011 98 %     Weight 05/28/18 1012 160 lb (72.6 kg)     Height --      Head Circumference --      Peak Flow --      Pain Score 05/28/18 1150 2     Pain Loc --      Pain Edu? --      Excl. in GC? --    No data found.  Updated Vital Signs BP 139/84 (BP Location: Left Arm)   Pulse 80   Temp 98.3 F (36.8 C)   Resp 18   Wt 160 lb (72.6 kg)   SpO2 98%   BMI 29.26 kg/m   Visual Acuity Right Eye Distance:   Left Eye Distance:   Bilateral Distance:    Right Eye Near:   Left Eye Near:    Bilateral Near:     Physical Exam  Constitutional: She is oriented to person, place, and time. She appears well-developed and well-nourished. No distress.  HENT:  Head: Normocephalic and atraumatic.  Bilateral ears without tenderness to palpation of external auricle, tragus and mastoid, EAC's without erythema or swelling, TM's with good bony  landmarks and cone of light. Non erythematous.  Nasal turbinates swollen and erythematous  Oral mucosa pink and moist, no tonsillar enlargement or exudate. Posterior pharynx patent and nonerythematous, no uvula deviation or swelling. Normal phonation.  Eyes: Pupils are equal, round, and reactive to light. Conjunctivae and EOM are normal.  Neck: Neck supple.  Cardiovascular: Normal rate and regular rhythm.  No murmur heard. Pulmonary/Chest: Effort normal and breath sounds normal. No respiratory distress.  Breathing comfortably at rest, CTABL, no wheezing, rales or other adventitious sounds auscultated  Occasional dry cough in  room  Abdominal: Soft. There is no tenderness.  Musculoskeletal: She exhibits no edema.  Neurological: She is alert and oriented to person, place, and time.  Skin: Skin is warm and dry.  Psychiatric: She has a normal mood and affect.  Nursing note and vitals reviewed.    UC Treatments / Results  Labs (all labs ordered are listed, but only abnormal results are displayed) Labs Reviewed - No data to display  EKG None  Radiology Dg Chest 2 View  Result Date: 05/28/2018 CLINICAL DATA:  Five months of occasionally productive cough. History of sickle cell anemia, diabetes, hypertension. EXAM: CHEST - 2 VIEW COMPARISON:  PA and lateral chest x-ray of February 23, 2018 FINDINGS: The lungs are mildly hypoinflated. There is respiratory motion artifact on the lateral view. The lung markings are mildly prominent in the retrocardiac region but a discrete infiltrate is not observed. The cardiac silhouette is enlarged. The pulmonary vascularity is not engorged. There is calcification in the wall of the aortic arch. The bony thorax is unremarkable. IMPRESSION: Mild bilateral hypoinflation. Mild enlargement of cardiac silhouette with mild pulmonary vascular prominence and an increase in the interstitial markings consistent with low-grade CHF. No alveolar pneumonia. Thoracic aortic  atherosclerosis. Electronically Signed   By: David  [place] M.D.   On: 05/28/2018 11:13    Procedures Procedures (including critical care time)  Medications Ordered in UC Medications - No data to display  Initial Impression / Assessment and Plan / UC Course  I have reviewed the triage vital signs and the nursing notes.  Pertinent labs & imaging results that were available during my care of the patient were reviewed by me and considered in my medical decision making (see chart for details).     Chest x-ray obtained given this has continued for 3 to 4 months since last x-ray.  X-ray showing vascular congestion concerning for CHF.  This would correlate well with her occasional leg swelling.  No pneumonia.  Will provide patient with 3 to 4 days of Bumex to take to decrease fluid.  Avoiding Lasix given patient has an allergy to Bactrim causing rash and patient has many other allergies causing rash.  Will hold sulfa at this time.  Will try Bumex daily.  Follow-up with PCP for further evaluation and treatment.  Follow-up here in emergency room if symptoms worsening or changing.  Continue to follow-up with gastroenterology as planned for further evaluation of reflux/colonoscopy.  Recommended to continue reflux medicine and Claritin as well.Discussed strict return precautions. Patient verbalized understanding and is agreeable with plan.  Final Clinical Impressions(s) / UC Diagnoses   Final diagnoses:  Cough  Pulmonary vascular congestion     Discharge Instructions     Please take bumex 1 tablet for the next 3-4 days Follow up with Primary care Continue reflux medicine, claritin Warm compresses to abdomen as this may Cloyce muscular strain from coughing  Follow up if cough worsening, developing fever, chest pain, shortness of breath, weakness   ED Prescriptions    Medication Sig Dispense Auth. Provider   bumetanide (BUMEX) 1 MG tablet Take 1 tablet (1 mg total) by mouth daily for 4 days. 4  tablet Schneur Crowson C, PA-C     Controlled Substance Prescriptions [place] Controlled Substance Registry consulted? Not Applicable   Lew Dawes, [place] 05/29/18 1610

## 2018-06-03 ENCOUNTER — Emergency Department (HOSPITAL_COMMUNITY): Payer: Self-pay

## 2018-06-03 ENCOUNTER — Telehealth: Payer: Self-pay | Admitting: *Deleted

## 2018-06-03 ENCOUNTER — Ambulatory Visit (AMBULATORY_SURGERY_CENTER): Payer: Self-pay | Admitting: *Deleted

## 2018-06-03 ENCOUNTER — Other Ambulatory Visit: Payer: Self-pay

## 2018-06-03 ENCOUNTER — Observation Stay (HOSPITAL_COMMUNITY)
Admission: EM | Admit: 2018-06-03 | Discharge: 2018-06-06 | Disposition: A | Discharge status: Home / Self Care | Payer: Self-pay | Attending: Internal Medicine | Admitting: Internal Medicine

## 2018-06-03 VITALS — Ht 61.0 in | Wt 166.0 lb

## 2018-06-03 DIAGNOSIS — K210 Gastro-esophageal reflux disease with esophagitis: Secondary | ICD-10-CM | POA: Insufficient documentation

## 2018-06-03 DIAGNOSIS — E871 Hypo-osmolality and hyponatremia: Secondary | ICD-10-CM | POA: Insufficient documentation

## 2018-06-03 DIAGNOSIS — K449 Diaphragmatic hernia without obstruction or gangrene: Secondary | ICD-10-CM | POA: Insufficient documentation

## 2018-06-03 DIAGNOSIS — I509 Heart failure, unspecified: Secondary | ICD-10-CM | POA: Insufficient documentation

## 2018-06-03 DIAGNOSIS — Z880 Allergy status to penicillin: Secondary | ICD-10-CM | POA: Insufficient documentation

## 2018-06-03 DIAGNOSIS — R55 Syncope and collapse: Principal | ICD-10-CM | POA: Insufficient documentation

## 2018-06-03 DIAGNOSIS — Z794 Long term (current) use of insulin: Secondary | ICD-10-CM | POA: Insufficient documentation

## 2018-06-03 DIAGNOSIS — I10 Essential (primary) hypertension: Secondary | ICD-10-CM | POA: Diagnosis present

## 2018-06-03 DIAGNOSIS — Z6831 Body mass index (BMI) 31.0-31.9, adult: Secondary | ICD-10-CM | POA: Insufficient documentation

## 2018-06-03 DIAGNOSIS — Z1211 Encounter for screening for malignant neoplasm of colon: Secondary | ICD-10-CM

## 2018-06-03 DIAGNOSIS — R059 Cough, unspecified: Secondary | ICD-10-CM

## 2018-06-03 DIAGNOSIS — R05 Cough: Secondary | ICD-10-CM | POA: Insufficient documentation

## 2018-06-03 DIAGNOSIS — I447 Left bundle-branch block, unspecified: Secondary | ICD-10-CM | POA: Insufficient documentation

## 2018-06-03 DIAGNOSIS — I110 Hypertensive heart disease with heart failure: Secondary | ICD-10-CM | POA: Insufficient documentation

## 2018-06-03 DIAGNOSIS — D573 Sickle-cell trait: Secondary | ICD-10-CM | POA: Insufficient documentation

## 2018-06-03 DIAGNOSIS — Z8249 Family history of ischemic heart disease and other diseases of the circulatory system: Secondary | ICD-10-CM | POA: Insufficient documentation

## 2018-06-03 DIAGNOSIS — Z7984 Long term (current) use of oral hypoglycemic drugs: Secondary | ICD-10-CM | POA: Insufficient documentation

## 2018-06-03 DIAGNOSIS — S01511A Laceration without foreign body of lip, initial encounter: Secondary | ICD-10-CM | POA: Insufficient documentation

## 2018-06-03 DIAGNOSIS — M549 Dorsalgia, unspecified: Secondary | ICD-10-CM

## 2018-06-03 DIAGNOSIS — K224 Dyskinesia of esophagus: Secondary | ICD-10-CM | POA: Insufficient documentation

## 2018-06-03 DIAGNOSIS — D72829 Elevated white blood cell count, unspecified: Secondary | ICD-10-CM | POA: Insufficient documentation

## 2018-06-03 DIAGNOSIS — E119 Type 2 diabetes mellitus without complications: Secondary | ICD-10-CM | POA: Insufficient documentation

## 2018-06-03 DIAGNOSIS — E669 Obesity, unspecified: Secondary | ICD-10-CM | POA: Insufficient documentation

## 2018-06-03 DIAGNOSIS — Z882 Allergy status to sulfonamides status: Secondary | ICD-10-CM | POA: Insufficient documentation

## 2018-06-03 DIAGNOSIS — W010XXA Fall on same level from slipping, tripping and stumbling without subsequent striking against object, initial encounter: Secondary | ICD-10-CM | POA: Insufficient documentation

## 2018-06-03 DIAGNOSIS — R1310 Dysphagia, unspecified: Secondary | ICD-10-CM

## 2018-06-03 LAB — CBC WITH DIFFERENTIAL/PLATELET
ABS IMMATURE GRANULOCYTES: 0.07 10*3/uL (ref 0.00–0.07)
BASOS ABS: 0.1 10*3/uL (ref 0.0–0.1)
Basophils Relative: 1 %
Eosinophils Absolute: 1 10*3/uL — ABNORMAL HIGH (ref 0.0–0.5)
Eosinophils Relative: 8 %
HEMATOCRIT: 34.7 % — AB (ref 36.0–46.0)
Hemoglobin: 11.5 g/dL — ABNORMAL LOW (ref 12.0–15.0)
IMMATURE GRANULOCYTES: 1 %
LYMPHS ABS: 3.7 10*3/uL (ref 0.7–4.0)
LYMPHS PCT: 27 %
MCH: 26.4 pg (ref 26.0–34.0)
MCHC: 33.1 g/dL (ref 30.0–36.0)
MCV: 79.8 fL — AB (ref 80.0–100.0)
MONOS PCT: 8 %
Monocytes Absolute: 1 10*3/uL (ref 0.1–1.0)
NEUTROS ABS: 7.7 10*3/uL (ref 1.7–7.7)
NEUTROS PCT: 55 %
NRBC: 0 % (ref 0.0–0.2)
Platelets: 404 10*3/uL — ABNORMAL HIGH (ref 150–400)
RBC: 4.35 MIL/uL (ref 3.87–5.11)
RDW: 12.4 % (ref 11.5–15.5)
WBC: 13.6 10*3/uL — ABNORMAL HIGH (ref 4.0–10.5)

## 2018-06-03 NOTE — Telephone Encounter (Signed)
Patient came in today with daughter for PV for upcoming screening colonoscopy on 10/31/9. She had EGD with you on 03/26/18. Patient was seen at Urgent care on 05/28/18 for cough. She was told she has CHF and was given medication and was also told to follow up with PCP, she does not have that appointment yet. Patient seems ok during PV no coughing noted. The cough was not new per daughter. Ok to proceed with colonoscopy as planned? Please advise. Thank you. Milayna Bell pv

## 2018-06-03 NOTE — ED Notes (Signed)
Bed: WA05 Expected date:  Expected time:  Means of arrival:  Comments: Triage 6 

## 2018-06-03 NOTE — ED Provider Notes (Signed)
MSE was initiated and I personally evaluated the patient and placed orders (if any) at  8:59 PM on June 03, 2018.  Presents to the emergency department for evaluation after an unwitnessed fall, patient is not sure why she fell or if she tripped over anything and family does think that she lost consciousness.  She is complaining of headache and also a small laceration to the upper lip with no active bleeding.  Patient also complaining of frontal headache, and is noted to Kaelan hypertensive on my evaluation.  She is repeatedly area she has not had any nausea or vomiting.  Denies any chest pain or shortness of breath at this moment but does report that her chest hurts when she coughs.  Was recently seen at urgent care for persistent cough and was noted to have mild increase in interstitial markings consistent with low-grade CHF and was started on Bumex.  Given that this fall was unwitnessed and patient is unsure if this was mechanical or not feel the patient will require further evaluation.  There is a small laceration to the upper lip that does not appear to require sutured repair.  The patient appears stable so that the remainder of the MSE may Ad[place]i completed by another provider.   Dartha Lodge, PA-C 06/03/18 2104    Arby Barrette, MD 06/04/18 [phone]

## 2018-06-03 NOTE — ED Triage Notes (Signed)
Family states that patient tripped today and fell. Pt has laceration on inside of upper lip

## 2018-06-03 NOTE — Progress Notes (Signed)
Patient's daughter with pt in PV interpretering for her. Patient denies any allergies to eggs or soy. Patient denies any problems with anesthesia/sedation. Patient denies any oxygen use at home. Patient denies taking any diet/weight loss medications or blood thinners. Patient went to ED on 05/28/18 for cough was told she has CHF, note sent to MD.

## 2018-06-03 NOTE — ED Notes (Signed)
Pt escorted with son who states that pt tripped and fell no loc. Pt sustained laceration  To,let side upper lip. Pt reports 6/10 discomfort, swelling is noted. Bleeding controlled

## 2018-06-03 NOTE — ED Provider Notes (Signed)
[facility] Provider Note   CSN: [id number] Arrival date & time: 06/03/18  1811     History   Chief Complaint Chief Complaint  Patient presents with  . Fall  . Lip Laceration    HPI Madeline Bell is a 62 y.o. montagn[place] female who presents with syncope and collapse. The patient speaks [place], an uncommon dialect, no professional interpreter could Madeline Bell found. The patient's daughter speaks english well and was able to translate. The patient was at home today  Began to cough. She had an episode of syncope.  Patient states that after she was coughing she does not remember anything but hitting the floor.  She has a small laceration on the inside of her upper lip.  She denies any pain in her teeth and her teeth are stable.  Her daughter states that she had an episode that was similar several days ago.  However this episode was more severe.  She did not have any tonic-clonic activity or other seizure-like activity and was awake and alert after her fall.  She was recently seen in urgent care for coughing and diagnosed with pulmonary edema and HAD a few days of Bumex.  Her symptoms have resolved.  She denies exertional dyspnea or PND.  HPI  Past Medical History:  Diagnosis Date  . CHF (congestive heart failure) (HCC)   . Diabetes mellitus without complication (HCC)   . Dysphagia    esophageal dysphagia  . GERD (gastroesophageal reflux disease)   . Heart murmur   . Hiatal hernia 03/26/2018   EGD 03/26/18 shows gastritis and hiatal hernia 4cm  . Hypertension   . Sickle cell anemia (HCC)    trait    Patient Active Problem List   Diagnosis Date Noted  . Hiatal hernia 03/26/2018  . Gastritis 03/26/2018  . HTN (hypertension), benign 05/02/2014    Past Surgical History:  Procedure Laterality Date  . UPPER GASTROINTESTINAL ENDOSCOPY  03/26/2018     OB History   None      Home Medications    Prior to Admission medications   Medication Sig Start Date  End Date Taking? Authorizing Provider  Ascorbic Acid (VITAMIN C PO) Take 1 tablet by mouth daily.    Yes [provider]  loratadine (CLARITIN) 10 MG tablet TAKE 1 TABLET BY MOUTH EVERY DAY Patient taking differently: Take 10 mg by mouth daily.  04/29/18  Yes Stallings, Zoe A, MD  losartan-hydrochlorothiazide (HYZAAR) 50-12.5 MG tablet TAKE 1 TABLET BY MOUTH EVERY DAY Patient taking differently: Take 1 tablet by mouth daily.  04/24/18  Yes Doristine Bosworth, MD  metFORMIN (GLUCOPHAGE) 1000 MG tablet Take 1 tablet (1,000 mg total) by mouth 2 (two) times daily with a meal. 04/10/18  Yes Stallings, Zoe A, MD  omeprazole (PRILOSEC) 20 MG capsule Take 1 capsule (20 mg total) by mouth 2 (two) times daily. 03/25/18  Yes Cirigliano, Vito V, DO  bisacodyl (DULCOLAX) 5 MG EC tablet Take 5 mg by mouth once.    [provider]  Continuous Blood Gluc Receiver (FREESTYLE LIBRE 14 DAY READER) DEVI 1 Device by Does not apply route QID. Use to check blood glucose 3 times a day 03/06/18   Doristine Bosworth, MD  Continuous Blood Gluc Sensor (FREESTYLE LIBRE 14 DAY SENSOR) MISC 1 Device by Does not apply route every 14 (fourteen) days. 03/06/18   Collie Siad A, MD  glucose blood test strip Use as instructed 03/06/18   Doristine Bosworth, MD  polyethylene glycol powder (MIRALAX) powder Take 1 Container by mouth once.    [provider]    Family History Family History  Problem Relation Age of Onset  . Hypertension Father   . Colon cancer Neg Hx   . Esophageal cancer Neg Hx   . Rectal cancer Neg Hx   . Stomach cancer Neg Hx     Social History Social History   Tobacco Use  . Smoking status: Never Smoker  . Smokeless tobacco: Never Used  Substance Use Topics  . Alcohol use: Not Currently  . Drug use: Never     Allergies   Tylenol [acetaminophen]; Amlodipine besylate; Lisinopril-hydrochlorothiazide; Bactrim [sulfamethoxazole-trimethoprim]; Cephalexin; Doxycycline; Gentamicin sulfate;  Nsaids; Penicillins; and Tetracyclines & related   Review of Systems Review of Systems  Ten systems reviewed and are negative for acute change, except as noted in the HPI.   Physical Exam Updated Vital Signs BP (!) 142/87   Pulse 80   Temp 98.6 F (37 C)   Resp 13   SpO2 95%   Physical Exam  Constitutional: She is oriented to person, place, and time. She appears well-developed and well-nourished. No distress.  HENT:  Head: Normocephalic.  Mouth/Throat:    Eyes: Conjunctivae are normal. No scleral icterus.  Neck: Normal range of motion.  Cardiovascular: Normal rate, regular rhythm and normal heart sounds. Exam reveals no gallop and no friction rub.  No murmur heard. Pulmonary/Chest: Effort normal and breath sounds normal. No respiratory distress.  Abdominal: Soft. Bowel sounds are normal. She exhibits no distension and no mass. There is no tenderness. There is no guarding.  Neurological: She is alert and oriented to person, place, and time.  Skin: Skin is warm and dry. She is not diaphoretic.  Psychiatric: Her behavior is normal.  Nursing note and vitals reviewed.    ED Treatments / Results  Labs (all labs ordered are listed, but only abnormal results are displayed) Labs Reviewed  CBC WITH DIFFERENTIAL/PLATELET - Abnormal; Notable for the following components:      Result Value   WBC 13.6 (*)    Hemoglobin 11.5 (*)    HCT 34.7 (*)    MCV 79.8 (*)    Platelets 404 (*)    Eosinophils Absolute 1.0 (*)    All other components within normal limits  BASIC METABOLIC PANEL - Abnormal; Notable for the following components:   Sodium 133 (*)    Chloride 95 (*)    Glucose, Bld 129 (*)    All other components within normal limits  I-STAT TROPONIN, ED    EKG EKG Interpretation  Date/Time:  Monday June 03 2018 23:21:15 EDT Ventricular Rate:  85 PR Interval:    QRS Duration: 140 QT Interval:  435 QTC Calculation: 518 R Axis:   40 Text Interpretation:  Sinus  rhythm Ventricular premature complex Left bundle branch block No old tracing to compare Confirmed by Derwood Kaplan [phone]) on 06/04/2018 12:38:18 AM   Radiology Dg Chest 2 View  Result Date: 06/03/2018 CLINICAL DATA:  Cough EXAM: CHEST - 2 VIEW COMPARISON:  05/28/2018 FINDINGS: Mild cardiomegaly. Lungs clear. No effusions. No acute bony abnormality. IMPRESSION: Mild cardiomegaly.  No active disease. Electronically Signed   By: Charlett Nose M.D.   On: 06/03/2018 21:21   Ct Head Wo Contrast  Result Date: 06/03/2018 CLINICAL DATA:  62 y/o  F; trip and fall. Laceration of upper lip. EXAM: CT HEAD WITHOUT CONTRAST TECHNIQUE: Contiguous axial images were obtained from the base of the  skull through the vertex without intravenous contrast. COMPARISON:  10/19/2017 CT head. FINDINGS: Brain: No evidence of acute infarction, hemorrhage, hydrocephalus, extra-axial collection or mass lesion/mass effect. Stable mild chronic microvascular ischemic changes and volume loss of the brain. Vascular: Calcific atherosclerosis of carotid siphons. Skull: Normal. Negative for fracture or focal lesion. Sinuses/Orbits: No acute finding. Other: None. IMPRESSION: 1. No acute intracranial abnormality or calvarial fracture. 2. Stable mild chronic microvascular ischemic changes and volume loss of the brain. Electronically Signed   By: Mitzi Hansen M.D.   On: 06/03/2018 21:48    Procedures Procedures (including critical care time)  Medications Ordered in ED Medications - No data to display   Initial Impression / Assessment and Plan / ED Course  I have reviewed the triage vital signs and the nursing notes.  Pertinent labs & imaging results that were available during my care of the patient were reviewed by me and considered in my medical decision making (see chart for details).     62 year old [place] female with several episodes of syncope following up coughing.  This may just Trudi related to vagal  stimulation however she does have an abnormal EKG and cardiomegaly with recent episode of what appears to Mersadies pulmonary edema likely secondary to CHF.  She has no echocardiogram and has not had a full work-up.  Patient seated shared visit with Dr. Rhunette Croft.  He will admit the patient.  She is stable throughout her visit  Final Clinical Impressions(s) / ED Diagnoses   Final diagnoses:  Cough syncope    ED Discharge Orders    None       Arthor Captain, PA-C 06/05/18 0141    Derwood Kaplan, MD 06/05/18 [phone]

## 2018-06-04 ENCOUNTER — Encounter (HOSPITAL_COMMUNITY): Payer: Self-pay | Admitting: *Deleted

## 2018-06-04 ENCOUNTER — Telehealth: Payer: Self-pay | Admitting: Gastroenterology

## 2018-06-04 ENCOUNTER — Observation Stay (HOSPITAL_BASED_OUTPATIENT_CLINIC_OR_DEPARTMENT_OTHER): Payer: Self-pay

## 2018-06-04 DIAGNOSIS — E871 Hypo-osmolality and hyponatremia: Secondary | ICD-10-CM

## 2018-06-04 DIAGNOSIS — R55 Syncope and collapse: Secondary | ICD-10-CM | POA: Diagnosis present

## 2018-06-04 DIAGNOSIS — E1169 Type 2 diabetes mellitus with other specified complication: Secondary | ICD-10-CM

## 2018-06-04 DIAGNOSIS — I447 Left bundle-branch block, unspecified: Secondary | ICD-10-CM | POA: Diagnosis present

## 2018-06-04 DIAGNOSIS — I5030 Unspecified diastolic (congestive) heart failure: Secondary | ICD-10-CM

## 2018-06-04 DIAGNOSIS — D72829 Elevated white blood cell count, unspecified: Secondary | ICD-10-CM

## 2018-06-04 DIAGNOSIS — R05 Cough: Secondary | ICD-10-CM

## 2018-06-04 LAB — CBC
HEMATOCRIT: 34.8 % — AB (ref 36.0–46.0)
HEMOGLOBIN: 11.2 g/dL — AB (ref 12.0–15.0)
MCH: 26 pg (ref 26.0–34.0)
MCHC: 32.2 g/dL (ref 30.0–36.0)
MCV: 80.7 fL (ref 80.0–100.0)
NRBC: 0 % (ref 0.0–0.2)
Platelets: 377 10*3/uL (ref 150–400)
RBC: 4.31 MIL/uL (ref 3.87–5.11)
RDW: 12.4 % (ref 11.5–15.5)
WBC: 11.1 10*3/uL — AB (ref 4.0–10.5)

## 2018-06-04 LAB — BASIC METABOLIC PANEL
ANION GAP: 11 (ref 5–15)
Anion gap: 11 (ref 5–15)
BUN: 15 mg/dL (ref 8–23)
BUN: 16 mg/dL (ref 8–23)
CHLORIDE: 95 mmol/L — AB (ref 98–111)
CO2: 27 mmol/L (ref 22–32)
CO2: 28 mmol/L (ref 22–32)
CREATININE: 0.76 mg/dL (ref 0.44–1.00)
Calcium: 9.2 mg/dL (ref 8.9–10.3)
Calcium: 9.4 mg/dL (ref 8.9–10.3)
Chloride: 95 mmol/L — ABNORMAL LOW (ref 98–111)
Creatinine, Ser: 0.71 mg/dL (ref 0.44–1.00)
GFR calc non Af Amer: >60 mL/min (ref >60)
GFR calc non Af Amer: >60 mL/min (ref >60)
Glucose, Bld: 129 mg/dL — ABNORMAL HIGH (ref 70–99)
Glucose, Bld: 140 mg/dL — ABNORMAL HIGH (ref 70–99)
Potassium: 3.8 mmol/L (ref 3.5–5.1)
Potassium: 3.9 mmol/L (ref 3.5–5.1)
SODIUM: 134 mmol/L — AB (ref 135–145)
Sodium: 133 mmol/L — ABNORMAL LOW (ref 135–145)

## 2018-06-04 LAB — URINALYSIS, ROUTINE W REFLEX MICROSCOPIC
Bilirubin Urine: NEGATIVE
Glucose, UA: NEGATIVE mg/dL
HGB URINE DIPSTICK: NEGATIVE
Ketones, ur: NEGATIVE mg/dL
Nitrite: NEGATIVE
PROTEIN: NEGATIVE mg/dL
Specific Gravity, Urine: 1.004 — ABNORMAL LOW (ref 1.005–1.030)
pH: 7 (ref 5.0–8.0)

## 2018-06-04 LAB — TROPONIN I: Troponin I: <0.03 ng/mL (ref <0.03)

## 2018-06-04 LAB — HEPATIC FUNCTION PANEL
ALT: 29 U/L (ref 0–44)
AST: 24 U/L (ref 15–41)
Albumin: 3.9 g/dL (ref 3.5–5.0)
Alkaline Phosphatase: 66 U/L (ref 38–126)
BILIRUBIN INDIRECT: 0.7 mg/dL (ref 0.3–0.9)
Bilirubin, Direct: 0.1 mg/dL (ref 0.0–0.2)
TOTAL PROTEIN: 7.7 g/dL (ref 6.5–8.1)
Total Bilirubin: 0.8 mg/dL (ref 0.3–1.2)

## 2018-06-04 LAB — TSH: TSH: 2.189 u[IU]/mL (ref 0.350–4.500)

## 2018-06-04 LAB — ECHOCARDIOGRAM COMPLETE
Height: 61 in
Weight: 2641.99 oz

## 2018-06-04 LAB — GLUCOSE, CAPILLARY
GLUCOSE-CAPILLARY: 127 mg/dL — AB (ref 70–99)
GLUCOSE-CAPILLARY: 121 mg/dL — AB (ref 70–99)
Glucose-Capillary: 141 mg/dL — ABNORMAL HIGH (ref 70–99)

## 2018-06-04 LAB — I-STAT TROPONIN, ED: TROPONIN I, POC: 0 ng/mL (ref 0.00–0.08)

## 2018-06-04 LAB — HIV ANTIBODY (ROUTINE TESTING W REFLEX): HIV Screen 4th Generation wRfx: NONREACTIVE

## 2018-06-04 LAB — MAGNESIUM: MAGNESIUM: 2 mg/dL (ref 1.7–2.4)

## 2018-06-04 MED ORDER — LOSARTAN POTASSIUM-HCTZ 50-12.5 MG PO TABS: 1.0000 | ORAL_TABLET | Freq: Every day | ORAL | Status: DC

## 2018-06-04 MED ORDER — VITAMINS A & D EX OINT
TOPICAL_OINTMENT | CUTANEOUS | Status: AC
  Administered 2018-06-05: 01:00:00
  Filled 2018-06-04: qty 5

## 2018-06-04 MED ORDER — HYDROCHLOROTHIAZIDE 12.5 MG PO CAPS: 12.5000 mg | ORAL_CAPSULE | Freq: Every day | ORAL | Status: DC

## 2018-06-04 MED ORDER — PANTOPRAZOLE SODIUM 40 MG PO TBEC
40.0000 mg | DELAYED_RELEASE_TABLET | Freq: Every day | ORAL | Status: DC
  Administered 2018-06-04 – 2018-06-06 (×3): 40 mg via ORAL
  Filled 2018-06-04 (×3): qty 1

## 2018-06-04 MED ORDER — SODIUM CHLORIDE 0.9 % IV SOLN
INTRAVENOUS | Status: DC
  Administered 2018-06-04 – 2018-06-06 (×4): via INTRAVENOUS

## 2018-06-04 MED ORDER — LORATADINE 10 MG PO TABS
10.0000 mg | ORAL_TABLET | Freq: Every day | ORAL | Status: DC
  Administered 2018-06-04 – 2018-06-06 (×3): 10 mg via ORAL
  Filled 2018-06-04 (×3): qty 1

## 2018-06-04 MED ORDER — LOSARTAN POTASSIUM 50 MG PO TABS
50.0000 mg | ORAL_TABLET | Freq: Every day | ORAL | Status: DC
  Administered 2018-06-04: 50 mg via ORAL
  Filled 2018-06-04: qty 1

## 2018-06-04 MED ORDER — ENOXAPARIN SODIUM 40 MG/0.4ML ~~LOC~~ SOLN: 40.0000 mg | SUBCUTANEOUS | Status: DC

## 2018-06-04 MED ORDER — VITAMINS A & D EX OINT: TOPICAL_OINTMENT | CUTANEOUS | Status: DC | PRN

## 2018-06-04 MED ORDER — SODIUM CHLORIDE 0.9 % IV BOLUS
500.0000 mL | Freq: Once | INTRAVENOUS | Status: AC
  Administered 2018-06-04: 500 mL via INTRAVENOUS

## 2018-06-04 MED ORDER — METOPROLOL TARTRATE 25 MG PO TABS
25.0000 mg | ORAL_TABLET | Freq: Two times a day (BID) | ORAL | Status: DC
  Administered 2018-06-04 – 2018-06-06 (×5): 25 mg via ORAL
  Filled 2018-06-04 (×5): qty 1

## 2018-06-04 MED ORDER — ENOXAPARIN SODIUM 40 MG/0.4ML ~~LOC~~ SOLN
40.0000 mg | SUBCUTANEOUS | Status: DC
  Administered 2018-06-04 – 2018-06-06 (×3): 40 mg via SUBCUTANEOUS
  Filled 2018-06-04 (×3): qty 0.4

## 2018-06-04 MED ORDER — INSULIN ASPART 100 UNIT/ML ~~LOC~~ SOLN
0.0000 [IU] | Freq: Three times a day (TID) | SUBCUTANEOUS | Status: DC
  Administered 2018-06-04 – 2018-06-05 (×4): 1 [IU] via SUBCUTANEOUS
  Administered 2018-06-05: 5 [IU] via SUBCUTANEOUS
  Administered 2018-06-05 – 2018-06-06 (×2): 1 [IU] via SUBCUTANEOUS
  Administered 2018-06-06: 2 [IU] via SUBCUTANEOUS

## 2018-06-04 MED ORDER — DEXTROMETHORPHAN POLISTIREX ER 30 MG/5ML PO SUER
30.0000 mg | Freq: Two times a day (BID) | ORAL | Status: DC | PRN
  Filled 2018-06-04: qty 5

## 2018-06-04 MED ORDER — HYDROCOD POLST-CPM POLST ER 10-8 MG/5ML PO SUER
5.0000 mL | Freq: Two times a day (BID) | ORAL | Status: DC | PRN
  Administered 2018-06-04 – 2018-06-05 (×2): 5 mL via ORAL
  Filled 2018-06-04 (×2): qty 5

## 2018-06-04 NOTE — Plan of Care (Signed)
Family asked questions and they were answered adequately. Pt has a good appetite.

## 2018-06-04 NOTE — Progress Notes (Addendum)
[facility]  HPI:  Madeline Bell is a 62 y.o. female with history of hypertension, diabetes mellitus type 2 was brought to the ER after patient had a loss of consciousness.  Per the family patient has been having recurrent episodes of syncope after coughing spells.  Had recently gone to urgent care center and was told that she has increased fluid and was given Lasix.  History evening around 5 PM patient had bouts of cough following which patient lost consciousness and hit her face onto the floor and lacerated her lip.  Patient was brought to the ER.  Per the family patient regained consciousness within few seconds each time.  Has not had any chest pain shortness of breath palpitations nausea vomiting headache visual symptoms or any incontinence of urine or bowel.  Subjective: patient's son stating that he has noted her to [name] coughing often when eating. he has noted that the cough has been severe since June. She has not had any new medications started. No post nasal drip. No h/o heartburn or indigestion. No weight loss. occassionally she coughs up blood when cough is severe. She states she has lower abdominal discomfort. No other complaints.   Exam: clear lungs, RRR  Principal Problem:   Syncope - likely cough syncope - ? If cough is related to aspiration - obtain SLP eval - Protonix daily handed - Troponin negative - d/c ARB and replace with Metoprolol- she is allergic to Norvasc and Hydralazine - CT head unrevealing - ECHO shows only grade 1 d CHF  Hyponatremia/ hyperchloremia - will hydrate with NS in case she is fluid depleted  Leukocytosis - ? Stress- no source of infection noted    HTN (hypertension), benign - see above- d/c ARB- add Metoprolol    LBBB (left bundle branch block)  DM2 - cont SSI  2 D ECHO: Study Conclusions  - Left ventricle: The cavity size was normal. Wall thickness was   normal. Systolic function was normal. The estimated ejection   fraction was in the  range of 60% to 65%. Wall motion was normal;   there were no regional wall motion abnormalities. Doppler   parameters are consistent with abnormal left ventricular   relaxation (grade 1 diastolic dysfunction). Doppler parameters   are consistent with elevated mean left atrial filling pressure. - Left atrium: The atrium was mildly dilated. - Pericardium, extracardiac: A trivial pericardial effusion was   identified.  Calvert Cantor, MD

## 2018-06-04 NOTE — Discharge Instructions (Signed)
Get help right away if: °You have a severe headache. °You have unusual pain in your chest, abdomen, or back. °You are bleeding from your mouth or rectum, or you have black or tarry stool. °You have a very fast or irregular heartbeat (palpitations). °You have pain with breathing. °You faint once or repeatedly. °You have a seizure. °You are confused. °You have trouble walking. °You have severe weakness. °You have vision problems. °

## 2018-06-04 NOTE — Progress Notes (Signed)
  Echocardiogram 2D Echocardiogram has been performed.  Bartosz Luginbill L Androw 06/04/2018, 10:06 AM

## 2018-06-04 NOTE — Telephone Encounter (Signed)
Dr. Barron Alvine and Kathe Becton,  This pt is cleared for anesthetic care at [facility].  Thanks,  Cathlyn Parsons

## 2018-06-04 NOTE — Care Management Note (Signed)
Case Management Note  Patient Details  Name: Madeline Bell MRN: [id number] Date of Birth: June 09, 1956  Subjective/Objective: Admitted w/syncope. Patient speaks Vietnamese-son in rm who speaks English-has pcp,has cane,from home w/son Madeline Bell-[phone],has [facility]. No CM needs.                   Action/Plan:d/c home.   Expected Discharge Date:                  Expected Discharge Plan:  Home/Self Care  In-House Referral:     Discharge planning Services  CM Consult  Post Acute Care Choice:    Choice offered to:     DME Arranged:    DME Agency:     HH Arranged:    HH Agency:     Status of Service:  In process, will continue to follow  If discussed at Long Length of Stay Meetings, dates discussed:    Additional Comments:  Lanier Clam, RN 06/04/2018, 3:47 PM

## 2018-06-04 NOTE — ED Notes (Signed)
ED TO INPATIENT HANDOFF REPORT  Name/Age/Gender Madeline Bell 62 y.o. female  Code Status   Home/SNF/Other Home  Chief Complaint fall, possible stitches in mouth  Level of Care/Admitting Diagnosis ED Disposition    ED Disposition Condition [place]: [place] [100102]  Level of Care: Telemetry [5]  Admit to tele based on following criteria: Eval of Syncope  Diagnosis: Syncope [656812]  Admitting Physician: Rise Patience [phone]  Attending Physician: Rise Patience [email or web address]  PT Class (Do Not Modify): Observation [104]  PT Acc Code (Do Not Modify): Observation [10022]       Medical History Past Medical History:  Diagnosis Date  . CHF (congestive heart failure) ([place])   . Diabetes mellitus without complication ([place])   . Dysphagia    esophageal dysphagia  . GERD (gastroesophageal reflux disease)   . Heart murmur   . Hiatal hernia 03/26/2018   EGD 03/26/18 shows gastritis and hiatal hernia 4cm  . Hypertension   . Sickle cell anemia (HCC)    trait    Allergies Allergies  Allergen Reactions  . Tylenol [Acetaminophen] Rash  . Amlodipine Besylate Rash  . Lisinopril-Hydrochlorothiazide   . Bactrim [Sulfamethoxazole-Trimethoprim]     Rash   . Cephalexin   . Doxycycline     Rash and itching and lips feel burnt, no anaphylaxis  . Gentamicin Sulfate   . Nsaids Rash  . Penicillins Rash and Other (See Comments)    Burning   Has patient had a PCN reaction causing immediate rash, facial/tongue/throat swelling, SOB or lightheadedness with hypotension: Yes Has patient had a PCN reaction causing severe rash involving mucus membranes or skin necrosis: No Has patient had a PCN reaction that required hospitalization: No Has patient had a PCN reaction occurring within the last 10 years: No If all of the above answers are "NO", then may proceed with Cephalosporin use.   . Tetracyclines & Related Rash    IV  Location/Drains/Wounds Patient Lines/Drains/Airways Status   Active Line/Drains/Airways    Name:   Placement date:   Placement time:   Site:   Days:   Peripheral IV 06/04/18 Left Hand   06/04/18    0202    Hand   less than 1          Labs/Imaging Results for orders placed or performed during the hospital encounter of 06/03/18 (from the past 48 hour(s))  CBC with Differential     Status: Abnormal   Collection Time: 06/03/18 11:40 PM  Result Value Ref Range   WBC 13.6 (H) 4.0 - 10.5 K/uL   RBC 4.35 3.87 - 5.11 MIL/uL   Hemoglobin 11.5 (L) 12.0 - 15.0 g/dL   HCT 34.7 (L) 36.0 - 46.0 %   MCV 79.8 (L) 80.0 - 100.0 fL   MCH 26.4 26.0 - 34.0 pg   MCHC 33.1 30.0 - 36.0 g/dL   RDW 12.4 11.5 - 15.5 %   Platelets 404 (H) 150 - 400 K/uL   nRBC 0.0 0.0 - 0.2 %   Neutrophils Relative % 55 %   Neutro Abs 7.7 1.7 - 7.7 K/uL   Lymphocytes Relative 27 %   Lymphs Abs 3.7 0.7 - 4.0 K/uL   Monocytes Relative 8 %   Monocytes Absolute 1.0 0.1 - 1.0 K/uL   Eosinophils Relative 8 %   Eosinophils Absolute 1.0 (H) 0.0 - 0.5 K/uL   Basophils Relative 1 %   Basophils Absolute 0.1 0.0 - 0.1 K/uL  Immature Granulocytes 1 %   Abs Immature Granulocytes 0.07 0.00 - 0.07 K/uL    Comment: Performed at [facility], [place] [address], [place], [place] [address]  Basic metabolic panel     Status: Abnormal   Collection Time: 06/03/18 11:40 PM  Result Value Ref Range   Sodium 133 (L) 135 - 145 mmol/L   Potassium 3.9 3.5 - 5.1 mmol/L   Chloride 95 (L) 98 - 111 mmol/L   CO2 27 22 - 32 mmol/L   Glucose, Bld 129 (H) 70 - 99 mg/dL   BUN 16 8 - 23 mg/dL   Creatinine, Ser 0.76 0.44 - 1.00 mg/dL   Calcium 9.4 8.9 - 10.3 mg/dL   GFR calc non Af Amer >60 >60 mL/min   GFR calc Af Amer >60 >60 mL/min    Comment: (NOTE) The eGFR has been calculated using the CKD EPI equation. This calculation has not been validated in all clinical situations. eGFR's persistently <60 mL/min signify possible Chronic  Kidney Disease.    Anion gap 11 5 - 15    Comment: Performed at [facility], [place] [address], [place], [place] [address]  I-stat troponin, ED     Status: None   Collection Time: 06/03/18 11:48 PM  Result Value Ref Range   Troponin i, poc 0.00 0.00 - 0.08 ng/mL   Comment 3            Comment: Due to the release kinetics of cTnI, a negative result within the first hours of the onset of symptoms does not rule out myocardial infarction with certainty. If myocardial infarction is still suspected, repeat the test at appropriate intervals.    Dg Chest 2 View  Result Date: 06/03/2018 CLINICAL DATA:  Cough EXAM: CHEST - 2 VIEW COMPARISON:  05/28/2018 FINDINGS: Mild cardiomegaly. Lungs clear. No effusions. No acute bony abnormality. IMPRESSION: Mild cardiomegaly.  No active disease. Electronically Signed   By: Rolm Baptise M.D.   On: 06/03/2018 21:21   Ct Head Wo Contrast  Result Date: 06/03/2018 CLINICAL DATA:  62 y/o  F; trip and fall. Laceration of upper lip. EXAM: CT HEAD WITHOUT CONTRAST TECHNIQUE: Contiguous axial images were obtained from the base of the skull through the vertex without intravenous contrast. COMPARISON:  10/19/2017 CT head. FINDINGS: Brain: No evidence of acute infarction, hemorrhage, hydrocephalus, extra-axial collection or mass lesion/mass effect. Stable mild chronic microvascular ischemic changes and volume loss of the brain. Vascular: Calcific atherosclerosis of carotid siphons. Skull: Normal. Negative for fracture or focal lesion. Sinuses/Orbits: No acute finding. Other: None. IMPRESSION: 1. No acute intracranial abnormality or calvarial fracture. 2. Stable mild chronic microvascular ischemic changes and volume loss of the brain. Electronically Signed   By: Kristine Garbe M.D.   On: 06/03/2018 21:48   EKG Interpretation  Date/Time:  Monday June 03 2018 23:21:15 EDT Ventricular Rate:  85 PR Interval:    QRS Duration: 140 QT  Interval:  435 QTC Calculation: 518 R Axis:   40 Text Interpretation:  Sinus rhythm Ventricular premature complex Left bundle branch block No old tracing to compare Confirmed by Varney Biles [phone]) on 06/04/2018 12:38:18 AM   Pending Labs Unresulted Labs (From admission, onward)   None      Vitals/Pain Today's Vitals   06/03/18 2308 06/03/18 2322 06/03/18 2323 06/04/18 0030  BP: 130/84 (!) 142/94 (!) 142/87 (!) 149/85  Pulse: 83 80 80 80  Resp:  (!) 9 13 12   Temp:  SpO2: 98% 97% 95% 95%  PainSc:    0-No pain    Isolation Precautions No active isolations  Medications Medications - No data to display  Mobility walks

## 2018-06-04 NOTE — Progress Notes (Addendum)
Pt C/O low, mid pelvic region pain made worse when she coughs. Pt states the pain started about 2 mos ago.

## 2018-06-04 NOTE — H&P (Signed)
History and Physical    Craig Baranski ZOX:[id number] DOB: Dec 06, 1955 DOA: 06/03/2018  PCP: Doristine Bosworth, MD  Patient coming from: Home.  History obtained from patient's daughter as patient does not speak [place].  Chief Complaint: Loss of consciousness.  HPI: Madeline Bell is a 62 y.o. female with history of hypertension, diabetes mellitus type 2 was brought to the ER after patient had a loss of consciousness.  Per the family patient has been having recurrent episodes of syncope after coughing spells.  Had recently gone to urgent care center and was told that she has increased fluid and was given Lasix.  History evening around 5 PM patient had bouts of cough following which patient lost consciousness and hit her face onto the floor and lacerated her lip.  Patient was brought to the ER.  Per the family patient regained consciousness within few seconds each time.  Has not had any chest pain shortness of breath palpitations nausea vomiting headache visual symptoms or any incontinence of urine or bowel.  ED Course: The ER patient's EKG shows normal sinus rhythm with LBBB.  Chest x-ray shows cardiomegaly.  Given the symptoms patient admitted for further management of syncope.  Review of Systems: As per HPI, rest all negative.   Past Medical History:  Diagnosis Date  . CHF (congestive heart failure) (HCC)   . Diabetes mellitus without complication (HCC)   . Dysphagia    esophageal dysphagia  . GERD (gastroesophageal reflux disease)   . Heart murmur   . Hiatal hernia 03/26/2018   EGD 03/26/18 shows gastritis and hiatal hernia 4cm  . Hypertension   . Sickle cell anemia (HCC)    trait    Past Surgical History:  Procedure Laterality Date  . UPPER GASTROINTESTINAL ENDOSCOPY  03/26/2018     reports that she has never smoked. She has never used smokeless tobacco. She reports that she drank alcohol. She reports that she does not use drugs.  Allergies  Allergen Reactions  . Tylenol  [Acetaminophen] Rash  . Amlodipine Besylate Rash  . Lisinopril-Hydrochlorothiazide   . Bactrim [Sulfamethoxazole-Trimethoprim]     Rash   . Cephalexin   . Doxycycline     Rash and itching and lips feel burnt, no anaphylaxis  . Gentamicin Sulfate   . Nsaids Rash  . Penicillins Rash and Other (See Comments)    Burning   Has patient had a PCN reaction causing immediate rash, facial/tongue/throat swelling, SOB or lightheadedness with hypotension: Yes Has patient had a PCN reaction causing severe rash involving mucus membranes or skin necrosis: No Has patient had a PCN reaction that required hospitalization: No Has patient had a PCN reaction occurring within the last 10 years: No If all of the above answers are "NO", then may proceed with Cephalosporin use.   . Tetracyclines & Related Rash    Family History  Problem Relation Age of Onset  . Hypertension Father   . Colon cancer Neg Hx   . Esophageal cancer Neg Hx   . Rectal cancer Neg Hx   . Stomach cancer Neg Hx     Prior to Admission medications   Medication Sig Start Date End Date Taking? Authorizing Provider  Ascorbic Acid (VITAMIN C PO) Take 1 tablet by mouth daily.    Yes [provider]  loratadine (CLARITIN) 10 MG tablet TAKE 1 TABLET BY MOUTH EVERY DAY Patient taking differently: Take 10 mg by mouth daily.  04/29/18  Yes Doristine Bosworth, MD  losartan-hydrochlorothiazide (HYZAAR) 50-12.5  MG tablet TAKE 1 TABLET BY MOUTH EVERY DAY Patient taking differently: Take 1 tablet by mouth daily.  04/24/18  Yes Doristine Bosworth, MD  metFORMIN (GLUCOPHAGE) 1000 MG tablet Take 1 tablet (1,000 mg total) by mouth 2 (two) times daily with a meal. 04/10/18  Yes Stallings, Zoe A, MD  omeprazole (PRILOSEC) 20 MG capsule Take 1 capsule (20 mg total) by mouth 2 (two) times daily. 03/25/18  Yes Cirigliano, Vito V, DO  bisacodyl (DULCOLAX) 5 MG EC tablet Take 5 mg by mouth once.    [provider]  Continuous Blood Gluc Receiver  (FREESTYLE LIBRE 14 DAY READER) DEVI 1 Device by Does not apply route QID. Use to check blood glucose 3 times a day 03/06/18   Doristine Bosworth, MD  Continuous Blood Gluc Sensor (FREESTYLE LIBRE 14 DAY SENSOR) MISC 1 Device by Does not apply route every 14 (fourteen) days. 03/06/18   Collie Siad A, MD  glucose blood test strip Use as instructed 03/06/18   Collie Siad A, MD  polyethylene glycol powder (MIRALAX) powder Take 1 Container by mouth once.    [provider]    Physical Exam: Vitals:   06/03/18 2322 06/03/18 2323 06/04/18 0030 06/04/18 0215  BP: (!) 142/94 (!) 142/87 (!) 149/85 (!) 151/94  Pulse: 80 80 80 81  Resp: (!) 9 13 12 18   Temp:    97.7 F (36.5 C)  TempSrc:    Oral  SpO2: 97% 95% 95% 99%  Weight:    74.9 kg  Height:    5\' 1"  (1.549 m)      Constitutional: Moderately built and nourished. Vitals:   06/03/18 2322 06/03/18 2323 06/04/18 0030 06/04/18 0215  BP: (!) 142/94 (!) 142/87 (!) 149/85 (!) 151/94  Pulse: 80 80 80 81  Resp: (!) 9 13 12 18   Temp:    97.7 F (36.5 C)  TempSrc:    Oral  SpO2: 97% 95% 95% 99%  Weight:    74.9 kg  Height:    5\' 1"  (1.549 m)   Eyes: Anicteric no pallor. ENMT: Mild swelling of the lips.  No discharge from the ears eyes nose or mouth. Neck: No mass felt.  No neck rigidity no JVD appreciated. Respiratory: No rhonchi or crepitations. Cardiovascular: S1-S2 heard no murmurs appreciated. Abdomen: Soft nontender bowel sounds present. Musculoskeletal: No edema.  No joint effusion. Skin: No rash.  Skin appears warm. Neurologic: Alert awake oriented to time place and person.  Moves all extremities. Psychiatric: Appears normal.  Normal affect.   Labs on Admission: I have personally reviewed following labs and imaging studies  CBC: Recent Labs  Lab 06/03/18 2340  WBC 13.6*  NEUTROABS 7.7  HGB 11.5*  HCT 34.7*  MCV 79.8*  PLT 404*   Basic Metabolic Panel: Recent Labs  Lab 06/03/18 2340  NA 133*  K 3.9  CL  95*  CO2 27  GLUCOSE 129*  BUN 16  CREATININE 0.76  CALCIUM 9.4   GFR: Estimated Creatinine Clearance: 67.5 mL/min (by C-G formula based on SCr of 0.76 mg/dL). Liver Function Tests: No results for input(s): AST, ALT, ALKPHOS, BILITOT, PROT, ALBUMIN in the last 168 hours. No results for input(s): LIPASE, AMYLASE in the last 168 hours. No results for input(s): AMMONIA in the last 168 hours. Coagulation Profile: No results for input(s): INR, PROTIME in the last 168 hours. Cardiac Enzymes: No results for input(s): CKTOTAL, CKMB, CKMBINDEX, TROPONINI in the last 168 hours. BNP (last 3 results)  Recent Labs    02/23/18 1520  PROBNP 12   HbA1C: No results for input(s): HGBA1C in the last 72 hours. CBG: No results for input(s): GLUCAP in the last 168 hours. Lipid Profile: No results for input(s): CHOL, HDL, LDLCALC, TRIG, CHOLHDL, LDLDIRECT in the last 72 hours. Thyroid Function Tests: No results for input(s): TSH, T4TOTAL, FREET4, T3FREE, THYROIDAB in the last 72 hours. Anemia Panel: No results for input(s): VITAMINB12, FOLATE, FERRITIN, TIBC, IRON, RETICCTPCT in the last 72 hours. Urine analysis:    Component Value Date/Time   COLORURINE STRAW (A) 03/13/2018 0112   APPEARANCEUR CLEAR 03/13/2018 0112   LABSPEC 1.003 (L) 03/13/2018 0112   PHURINE 6.0 03/13/2018 0112   GLUCOSEU NEGATIVE 03/13/2018 0112   HGBUR NEGATIVE 03/13/2018 0112   BILIRUBINUR negative 04/10/2018 0854   KETONESUR negative 04/10/2018 0854   KETONESUR NEGATIVE 03/13/2018 0112   PROTEINUR negative 04/10/2018 0854   PROTEINUR NEGATIVE 03/13/2018 0112   UROBILINOGEN 0.2 04/10/2018 0854   NITRITE Negative 04/10/2018 0854   NITRITE NEGATIVE 03/13/2018 0112   LEUKOCYTESUR Trace (A) 04/10/2018 0854   Sepsis Labs: @LABRCNTIP (procalcitonin:4,lacticidven:4) )No results found for this or any previous visit (from the past 240 hour(s)).   Radiological Exams on Admission: Dg Chest 2 View  Result Date:  06/03/2018 CLINICAL DATA:  Cough EXAM: CHEST - 2 VIEW COMPARISON:  05/28/2018 FINDINGS: Mild cardiomegaly. Lungs clear. No effusions. No acute bony abnormality. IMPRESSION: Mild cardiomegaly.  No active disease. Electronically Signed   By: Charlett Nose M.D.   On: 06/03/2018 21:21   Ct Head Wo Contrast  Result Date: 06/03/2018 CLINICAL DATA:  62 y/o  F; trip and fall. Laceration of upper lip. EXAM: CT HEAD WITHOUT CONTRAST TECHNIQUE: Contiguous axial images were obtained from the base of the skull through the vertex without intravenous contrast. COMPARISON:  10/19/2017 CT head. FINDINGS: Brain: No evidence of acute infarction, hemorrhage, hydrocephalus, extra-axial collection or mass lesion/mass effect. Stable mild chronic microvascular ischemic changes and volume loss of the brain. Vascular: Calcific atherosclerosis of carotid siphons. Skull: Normal. Negative for fracture or focal lesion. Sinuses/Orbits: No acute finding. Other: None. IMPRESSION: 1. No acute intracranial abnormality or calvarial fracture. 2. Stable mild chronic microvascular ischemic changes and volume loss of the brain. Electronically Signed   By: Mitzi Hansen M.D.   On: 06/03/2018 21:48    EKG: Independently reviewed.  Normal sinus rhythm with LBBB.  Assessment/Plan Principal Problem:   Syncope Active Problems:   HTN (hypertension), benign   LBBB (left bundle branch block)    1. Syncope appears to [place] related to cough.  However since patient has LBBB and cardiomegaly will check 2D echo cycle cardiac markers monitor in telemetry. 2. Hypertension on ARB and hydrochlorothiazide. 3. Diabetes mellitus type 2 we will keep patient on sliding scale coverage and hold metformin while inpatient. 4. LBBB appears to [place] chronic. 5. Anemia appears to Charlsie new.  Follow CBC.   DVT prophylaxis: Lovenox. Code Status: Full code. Family Communication: Patient's son and daughter. Disposition Plan: Home. Consults called:  None. Admission status: Observation.   Eduard Clos MD [facility] Pager [phone].  If 7PM-7AM, please contact night-coverage [email or web address] Password [email or web address]  06/04/2018, 3:08 AM

## 2018-06-04 NOTE — Telephone Encounter (Signed)
Patient has been admitted after a syncopal episode from coughing.  She is being evaluated for cardiomegaly and syncope.  She is scheduled for colonoscopy to work up her anemia.  I spoke with her daughter.  She will call me back once her mother has been discharged to discuss rescheduling her colonoscopy or keeping it as scheduled if she is cleared by cardiology. Currently on Lovenox while inpatient.  Please advise if you would like to change this plan.

## 2018-06-05 ENCOUNTER — Observation Stay (HOSPITAL_COMMUNITY): Payer: Self-pay

## 2018-06-05 DIAGNOSIS — E878 Other disorders of electrolyte and fluid balance, not elsewhere classified: Secondary | ICD-10-CM

## 2018-06-05 LAB — CBC
HCT: 32.8 % — ABNORMAL LOW (ref 36.0–46.0)
Hemoglobin: 10.4 g/dL — ABNORMAL LOW (ref 12.0–15.0)
MCH: 26.3 pg (ref 26.0–34.0)
MCHC: 31.7 g/dL (ref 30.0–36.0)
MCV: 82.8 fL (ref 80.0–100.0)
NRBC: 0 % (ref 0.0–0.2)
PLATELETS: 374 10*3/uL (ref 150–400)
RBC: 3.96 MIL/uL (ref 3.87–5.11)
RDW: 12.7 % (ref 11.5–15.5)
WBC: 8.3 10*3/uL (ref 4.0–10.5)

## 2018-06-05 LAB — BASIC METABOLIC PANEL
ANION GAP: 9 (ref 5–15)
BUN: 14 mg/dL (ref 8–23)
CALCIUM: 8.8 mg/dL — AB (ref 8.9–10.3)
CHLORIDE: 104 mmol/L (ref 98–111)
CO2: 26 mmol/L (ref 22–32)
Creatinine, Ser: 0.75 mg/dL (ref 0.44–1.00)
GFR calc non Af Amer: >60 mL/min (ref >60)
Glucose, Bld: 125 mg/dL — ABNORMAL HIGH (ref 70–99)
Potassium: 4.2 mmol/L (ref 3.5–5.1)
Sodium: 139 mmol/L (ref 135–145)

## 2018-06-05 LAB — GLUCOSE, CAPILLARY
GLUCOSE-CAPILLARY: 149 mg/dL — AB (ref 70–99)
GLUCOSE-CAPILLARY: 215 mg/dL — AB (ref 70–99)
Glucose-Capillary: 123 mg/dL — ABNORMAL HIGH (ref 70–99)
Glucose-Capillary: 287 mg/dL — ABNORMAL HIGH (ref 70–99)

## 2018-06-05 LAB — MAGNESIUM: MAGNESIUM: 2.1 mg/dL (ref 1.7–2.4)

## 2018-06-05 LAB — PHOSPHORUS: Phosphorus: 3.8 mg/dL (ref 2.5–4.6)

## 2018-06-05 NOTE — Evaluation (Signed)
Clinical/Bedside Swallow Evaluation Patient Details  Name: Eniola Cerullo MRN: [id number] Date of Birth: [birth year]  Today's Date: 06/05/2018 Time: SLP Start Time (ACUTE ONLY): 1135 SLP Stop Time (ACUTE ONLY): 1240 SLP Time Calculation (min) (ACUTE ONLY): 65 min  Past Medical History:  Past Medical History:  Diagnosis Date  . CHF (congestive heart failure) (HCC)   . Diabetes mellitus without complication (HCC)   . Dysphagia    esophageal dysphagia  . GERD (gastroesophageal reflux disease)   . Heart murmur   . Hiatal hernia 03/26/2018   EGD 03/26/18 shows gastritis and hiatal hernia 4cm  . Hypertension   . Sickle cell anemia (HCC)    trait   Past Surgical History:  Past Surgical History:  Procedure Laterality Date  . UPPER GASTROINTESTINAL ENDOSCOPY  03/26/2018   HPI:  62 yo female adm to [facility] with syncope.  Concern for syncope after cough being associated with intake.  Pt reports progressive dysphagia x2 months - slow onset.  Suprasternal notch and anterior neck is where food lodges.  Pt has not had weight loss nor pnas since dysphagia set in.  She did have endoscopy 04/13/2018 negative x gastritis, upper esophagus normal, erythematous duodenopathy and pt was empirically stretched per daughter.  Pt reports some improvement with swallowing since dilatation but not cough.  Per daughter when pt coughs with intake she has to belch to release pressure.  Swallow evaluation ordered.     Assessment / Plan / Recommendation Clinical Impression  Daughter requested SlP not use interpreter as she stated she understood her mother best.  CN exam is negative.  Reflux Symptom Index score was 24/45- indicating high likelihood of LPR.  No indications of aspiration or dysphagia with initial intake - including applesauce, cracker and juice.  She did present with delayed cough, belch and expectoration of secretions - concerning for refluxing/? motility issues/inadequate LES closure.   Pt and family state this is  what occurs at home.   SLP Visit Diagnosis: Dysphagia, unspecified (R13.10)    Aspiration Risk  Mild aspiration risk    Diet Recommendation NPO(pending esophagram)   Liquid Administration via: Cup;Straw Medication Administration: Whole meds with liquid Supervision: Patient able to self feed Compensations: Slow rate;Small sips/bites Postural Changes: Seated upright at 90 degrees;Remain upright for at least 30 minutes after po intake    Other  Recommendations Recommended Consults: Consider esophageal assessment Oral Care Recommendations: Oral care BID   Follow up Recommendations None      Frequency and Duration     n/a       Prognosis    n/a    Swallow Study   General Date of Onset: 06/05/18 HPI: 62 yo female adm to [facility] with syncope.  Concern for syncope after cough being associated with intake.  Pt reports progressive dysphagia x2 months - slow onset.  Suprasternal notch and anterior neck is where food lodges.  Pt has not had weight loss nor pnas since dysphagia set in.  She did have endoscopy 04/13/2018 negative x gastritis, upper esophagus normal, erythematous duodenopathy and pt was empirically stretched per daughter.  Pt reports some improvement with swallowing since dilatation but not cough.  Per daughter when pt coughs with intake she has to belch to release pressure.  Swallow evaluation ordered.   Previous Swallow Assessment: endoscopy gastritis, hiatal hernia, ? dilated GE flap valve- wide open lumen - please see report in epic 04/13/2018 completed Diet Prior to this Study: Regular;Thin liquids Temperature Spikes Noted: No Respiratory Status: Room air  History of Recent Intubation: No Behavior/Cognition: Alert;Cooperative Oral Cavity Assessment: Within Functional Limits Oral Care Completed by SLP: No Oral Cavity - Dentition: Adequate natural dentition Vision: Functional for self-feeding Self-Feeding Abilities: Able to feed self Patient Positioning: Upright in  bed Baseline Vocal Quality: Normal Volitional Cough: Strong Volitional Swallow: Able to elicit    Oral/Motor/Sensory Function Overall Oral Motor/Sensory Function: Within functional limits   Ice Chips Ice chips: Not tested   Thin Liquid Thin Liquid: Within functional limits Presentation: Self Fed;Cup    Nectar Thick Nectar Thick Liquid: Not tested   Honey Thick Honey Thick Liquid: Not tested   Puree Puree: Within functional limits Presentation: Self Fed;Spoon   Solid     Presentation: Self Fed;Spoon Other Comments: cough with expectoration of secretions after a few minutes      Chales Abrahams 06/05/2018,12:52 PM  Donavan Burnet, MS [facility] SLP Acute Rehab Services Pager [phone] Office [phone]

## 2018-06-05 NOTE — Evaluation (Addendum)
Physical Therapy Evaluation-1x Patient Details Name: Madeline Bell MRN: [id number] DOB: July 02, 1956 Today's Date: 06/05/2018   History of Present Illness  62 yo female admitted with syncope/collapse. Hx of CHF, DM, dysphagia, anemia, LBBB, HTN. Pt is non [place] speaking.   Clinical Impression  On eval, pt was Mod Ind with mobility. She walked ~300 feet. No overt LOB. No c/o lightheadedness/dizziness. No acute PT needs. Nursing/family can assist with mobility.  1x eval. Will sign off.     Follow Up Recommendations No PT follow up    Equipment Recommendations  None recommended by PT    Recommendations for Other Services       Precautions / Restrictions Precautions Precautions: Fall Restrictions Weight Bearing Restrictions: No      Mobility  Bed Mobility Overal bed mobility: Independent                Transfers Overall transfer level: Independent                  Ambulation/Gait Ambulation/Gait assistance: Modified independent (Device/Increase time) Gait Distance (Feet): 300 Feet Assistive device: None Gait Pattern/deviations: Step-through pattern;Drifts right/left     General Gait Details: No overt LOB. Mildly unsteady intermittently. No c/o dizziness/lightheadedness. Pt tolerated distance well.   Stairs            Wheelchair Mobility    Modified Rankin (Stroke Patients Only)       Balance Overall balance assessment: Mild deficits observed, not formally tested                                           Pertinent Vitals/Pain Pain Assessment: Faces Faces Pain Scale: No hurt    Home Living Family/patient expects to [name] discharged to:: Private residence Living Arrangements: Children Available Help at Discharge: Family Type of Home: House Home Access: Stairs to enter   Secretary/administrator of Steps: 3 Home Layout: One level Home Equipment: None      Prior Function Level of Independence: Independent                Hand Dominance        Extremity/Trunk Assessment   Upper Extremity Assessment Upper Extremity Assessment: Defer to OT evaluation    Lower Extremity Assessment Lower Extremity Assessment: Overall WFL for tasks assessed    Cervical / Trunk Assessment Cervical / Trunk Assessment: Normal  Communication   Communication: Prefers language other than English(daughter provided translation to pt)  Cognition Arousal/Alertness: Awake/alert Behavior During Therapy: WFL for tasks assessed/performed Overall Cognitive Status: Difficult to assess                                        General Comments      Exercises     Assessment/Plan    PT Assessment Patent does not need any further PT services  PT Problem List         PT Treatment Interventions      PT Goals (Current goals can Kagan found in the Care Plan section)  Acute Rehab PT Goals Patient Stated Goal: none stated PT Goal Formulation: All assessment and education complete, DC therapy    Frequency     Barriers to discharge        Co-evaluation  AM-PAC PT "6 Clicks" Daily Activity  Outcome Measure Difficulty turning over in bed (including adjusting bedclothes, sheets and blankets)?: None Difficulty moving from lying on back to sitting on the side of the bed? : None Difficulty sitting down on and standing up from a chair with arms (e.g., wheelchair, bedside commode, etc,.)?: None Help needed moving to and from a bed to chair (including a wheelchair)?: None Help needed walking in hospital room?: None Help needed climbing 3-5 steps with a railing? : A Little 6 Click Score: 23    End of Session Equipment Utilized During Treatment: Gait belt Activity Tolerance: Patient tolerated treatment well Patient left: in bed;with call bell/phone within reach;with family/visitor present        Time: 4098-1191 PT Time Calculation (min) (ACUTE ONLY): 8 min   Charges:   PT  Evaluation $PT Eval Low Complexity: 1 Low            Rebeca Alert, PT Acute Rehabilitation Services Pager: [phone] Office: [phone]

## 2018-06-05 NOTE — Progress Notes (Signed)
PROGRESS NOTE    Madeline Bell  ZOX:[id number] DOB: [birth year] DOA: 06/03/2018 PCP: Doristine Bosworth, MD  Brief Narrative:  Madeline Bell a 62 y.o.femalewithhistory of hypertension, diabetes mellitus type 2 was brought to the ER after patient had a loss of consciousness. Per the family patient has been having recurrent episodes of syncope after coughing spells. Had recently gone to urgent care center and was told that she has increased fluid and was given Lasix. History evening around 5 PM patient had bouts of cough following which patient lost consciousness and hit her face onto the floor and lacerated her lip. Patient was brought to the ER. Per the family patient regained consciousness within few seconds each time. Has not had any chest pain shortness of breath palpitations nausea vomiting headache visual symptoms or any incontinence of urine or bowel.  Assessment & Plan:   Principal Problem:   Syncope Active Problems:   HTN (hypertension), benign   LBBB (left bundle branch block)  Syncope -likely cough mediated syncope -Continue on IV fluid hydration with normal saline at 75 mL's per hour for now -? If cough is related to aspiration - obtain SLP eval -Pantoprazole 40 mg p.o. daily and will continue -Troponin negative -D/C'd ARB-HCTZ combination and replaced with Metoprolol as she is allergic to Norvasc and Hydralazine -CT head unrevealing -ECHO shows only grade 1 d CHF -PT/OT to evaluate and treat  Chronic Cough -Obtaining SLP evaluation and they have recommended an esophagram which is been ordered -Now patient is n.p.o. till esophagram is being done -Continue with pantoprazole 40 g p.o. daily -Continue with cough suppression with Dostinex and Delsym as needed -Patient had recent EGD done for Esophageal Dysphagia and was reviewed and showed: LA Grade A reflux esophagitis. Esophagogastric landmarks identified as above. 4 cm hiatal hernia. Normal upper third of esophagus and  middle third of esophagus. Dilated. Gastroesophageal flap valve classified as Hill Grade IV (no fold, wide open lumen, hiatal hernia present). Gastritis. Biopsied. Erythematous duodenopathy. Biopsied. Normal second portion of the duodenum. Biopsied. -Await esophagram findings -Aspiration precautions   Hyponatremia/ Hyperchloremia -Improved. -Patient's sodium level went from 134 and is now 139 -Patient's chloride level went from 95 is now 104 -Continue to monitor and continue with IV fluid hydration for now and repeat CMP in the a.m.  Leukocytosis -? Stress -no source of infection noted -Improved now his WBC went from 13.6 now 8.3  HTN (hypertension), benign -see above -D/C'd ARB-HTCZ and added Metoprolol  LBBB (left bundle branch block) -Chronic -Continue to Monitor on Telemetry   DM2 -Hold Metformin 1000 mg po BID  -CBGs have been ranging from 121-287 -Continue with sensitive sliding scale NovoLog insulin -Check HbA1c  GERD -Continue with PPI  Back Pain -In the setting of coughing incident -check lumbar x-ray  Normocytic Anemia -Hb/Hct went from 11.5/34.7 -> 10.4/32.8 -Suspect dilutional drop IV fluid hydration -Check anemia panel in a.m. -Continue monitor for signs and symptoms of bleeding -Repeat CBC in the a.m.  Obesity -Estimated body mass index is 31.2 kg/m as calculated from the following:   Height as of this encounter: 5\' 1"  (1.549 m).   Weight as of this encounter: 74.9 kg. -Weight Loss Counseling given   DVT prophylaxis: Enoxaparin 40 mg sq q24h Code Status: FULL CODE Family Communication: Discussed with son at bedside Disposition Plan: Anticipate D/C in the next 24-48 hours is medically stable   Consultants:   SLP  Procedures:  Esophagram ordered and pending to [name] done   ECHOCARDIOGRAM -------------------------------------------------------------------  Study Conclusions  - Left ventricle: The cavity size was normal. Wall thickness  was   normal. Systolic function was normal. The estimated ejection   fraction was in the range of 60% to 65%. Wall motion was normal;   there were no regional wall motion abnormalities. Doppler   parameters are consistent with abnormal left ventricular   relaxation (grade 1 diastolic dysfunction). Doppler parameters   are consistent with elevated mean left atrial filling pressure. - Left atrium: The atrium was mildly dilated. - Pericardium, extracardiac: A trivial pericardial effusion was   identified.  Antimicrobials:  Anti-infectives (From admission, onward)   None     Subjective: Seen and examined with the assistance of translation from the nurse as well as patient's son.  Patient states that she did not have any problems right now because she is not coughing.  Patient's son states that she coughs violently and then passed out.  No nausea or vomiting.  No lightheadedness or dizziness currently.  Patient denies any chest pain at this time and states that she has intermittent back pain and it flares when she coughs significantly.  Objective: Vitals:   06/04/18 1258 06/04/18 2206 06/05/18 0618 06/05/18 0929  BP: 115/76 111/68 118/73 119/72  Pulse: 80 79 67 66  Resp: 16 18 18    Temp: 98.1 F (36.7 C) 99.1 F (37.3 C) 98.5 F (36.9 C)   TempSrc: Oral Oral Oral   SpO2: 95% 96% 97%   Weight:      Height:        Intake/Output Summary (Last 24 hours) at 06/05/2018 1425 Last data filed at 06/05/2018 1009 Gross per 24 hour  Intake 555.18 ml  Output 700 ml  Net -144.82 ml   Filed Weights   06/04/18 0215  Weight: 74.9 kg   Examination: Physical Exam:  Constitutional: WN/WD obese [name] in NAD and appears calm and comfortable Eyes: Lds and conjunctivae normal, sclerae anicteric  ENMT: External Ears, Nose appear normal. Grossly normal hearing. Mucous membranes are moist.  Neck: Appears normal, supple, no cervical masses, normal ROM, no appreciable thyromegaly; no  JVD Respiratory: Clear to auscultation bilaterally, no wheezing, rales, rhonchi or crackles. Normal respiratory effort and patient is not tachypenic. No accessory muscle use.  Cardiovascular: RRR, no murmurs / rubs / gallops. S1 and S2 auscultated. No extremity edema. Abdomen: Soft, non-tender, non-distended. No masses palpated. No appreciable hepatosplenomegaly. Bowel sounds positive x4.  GU: Deferred. Musculoskeletal: No clubbing / cyanosis of digits/nails. Normal strength and muscle tone.  Skin: No rashes, lesions, ulcers on a limited skin eval. No induration; Warm and dry.  Neurologic: CN 2-12 grossly intact with no focal deficits. Romberg sign and cerebellar reflexes not assessed.  Psychiatric: Normal judgment and insight. Alert and oriented x 3. Normal mood and appropriate affect.   Data Reviewed: I have personally reviewed following labs and imaging studies  CBC: Recent Labs  Lab 06/03/18 2340 06/04/18 0351 06/05/18 0548  WBC 13.6* 11.1* 8.3  NEUTROABS 7.7  --   --   HGB 11.5* 11.2* 10.4*  HCT 34.7* 34.8* 32.8*  MCV 79.8* 80.7 82.8  PLT 404* 377 374   Basic Metabolic Panel: Recent Labs  Lab 06/03/18 2340 06/04/18 0351 06/05/18 0548  NA 133* 134* 139  K 3.9 3.8 4.2  CL 95* 95* 104  CO2 27 28 26   GLUCOSE 129* 140* 125*  BUN 16 15 14   CREATININE 0.76 0.71 0.75  CALCIUM 9.4 9.2 8.8*  MG  --  2.0  2.1  PHOS  --   --  3.8   GFR: Estimated Creatinine Clearance: 67.5 mL/min (by C-G formula based on SCr of 0.75 mg/dL). Liver Function Tests: Recent Labs  Lab 06/04/18 0351  AST 24  ALT 29  ALKPHOS 66  BILITOT 0.8  PROT 7.7  ALBUMIN 3.9   No results for input(s): LIPASE, AMYLASE in the last 168 hours. No results for input(s): AMMONIA in the last 168 hours. Coagulation Profile: No results for input(s): INR, PROTIME in the last 168 hours. Cardiac Enzymes: Recent Labs  Lab 06/04/18 0351 06/04/18 0904 06/04/18 1440  TROPONINI <0.03 <0.03 <0.03   BNP (last 3  results) Recent Labs    02/23/18 1520  PROBNP 12   HbA1C: No results for input(s): HGBA1C in the last 72 hours. CBG: Recent Labs  Lab 06/04/18 0732 06/04/18 1145 06/04/18 1721 06/05/18 0736 06/05/18 1151  GLUCAP 141* 127* 121* 123* 287*   Lipid Profile: No results for input(s): CHOL, HDL, LDLCALC, TRIG, CHOLHDL, LDLDIRECT in the last 72 hours. Thyroid Function Tests: Recent Labs    06/04/18 0351  TSH 2.189   Anemia Panel: No results for input(s): VITAMINB12, FOLATE, FERRITIN, TIBC, IRON, RETICCTPCT in the last 72 hours. Sepsis Labs: No results for input(s): PROCALCITON, LATICACIDVEN in the last 168 hours.  No results found for this or any previous visit (from the past 240 hour(s)).   Radiology Studies: Dg Chest 2 View  Result Date: 06/03/2018 CLINICAL DATA:  Cough EXAM: CHEST - 2 VIEW COMPARISON:  05/28/2018 FINDINGS: Mild cardiomegaly. Lungs clear. No effusions. No acute bony abnormality. IMPRESSION: Mild cardiomegaly.  No active disease. Electronically Signed   By: Charlett Nose M.D.   On: 06/03/2018 21:21   Ct Head Wo Contrast  Result Date: 06/03/2018 CLINICAL DATA:  62 y/o  F; trip and fall. Laceration of upper lip. EXAM: CT HEAD WITHOUT CONTRAST TECHNIQUE: Contiguous axial images were obtained from the base of the skull through the vertex without intravenous contrast. COMPARISON:  10/19/2017 CT head. FINDINGS: Brain: No evidence of acute infarction, hemorrhage, hydrocephalus, extra-axial collection or mass lesion/mass effect. Stable mild chronic microvascular ischemic changes and volume loss of the brain. Vascular: Calcific atherosclerosis of carotid siphons. Skull: Normal. Negative for fracture or focal lesion. Sinuses/Orbits: No acute finding. Other: None. IMPRESSION: 1. No acute intracranial abnormality or calvarial fracture. 2. Stable mild chronic microvascular ischemic changes and volume loss of the brain. Electronically Signed   By: Mitzi Hansen M.D.    On: 06/03/2018 21:48   Scheduled Meds: . enoxaparin (LOVENOX) injection  40 mg Subcutaneous Q24H  . insulin aspart  0-9 Units Subcutaneous TID WC  . loratadine  10 mg Oral Daily  . metoprolol tartrate  25 mg Oral BID  . pantoprazole  40 mg Oral Daily   Continuous Infusions: . sodium chloride 75 mL/hr at 06/05/18 1209    LOS: 0 days   Madeline Laughter, DO [facility] PAGER is on AMION  If 7PM-7AM, please contact night-coverage [email or web address] Password [email or web address] 06/05/2018, 2:25 PM

## 2018-06-06 DIAGNOSIS — I447 Left bundle-branch block, unspecified: Secondary | ICD-10-CM

## 2018-06-06 DIAGNOSIS — I10 Essential (primary) hypertension: Secondary | ICD-10-CM

## 2018-06-06 DIAGNOSIS — R55 Syncope and collapse: Secondary | ICD-10-CM

## 2018-06-06 LAB — CBC WITH DIFFERENTIAL/PLATELET
ABS IMMATURE GRANULOCYTES: 0.03 10*3/uL (ref 0.00–0.07)
BASOS ABS: 0.1 10*3/uL (ref 0.0–0.1)
Basophils Relative: 1 %
EOS ABS: 1.1 10*3/uL — AB (ref 0.0–0.5)
EOS PCT: 12 %
HCT: 32.5 % — ABNORMAL LOW (ref 36.0–46.0)
HEMOGLOBIN: 10.1 g/dL — AB (ref 12.0–15.0)
Immature Granulocytes: 0 %
LYMPHS ABS: 3.1 10*3/uL (ref 0.7–4.0)
LYMPHS PCT: 33 %
MCH: 26.1 pg (ref 26.0–34.0)
MCHC: 31.1 g/dL (ref 30.0–36.0)
MCV: 84 fL (ref 80.0–100.0)
Monocytes Absolute: 0.9 10*3/uL (ref 0.1–1.0)
Monocytes Relative: 9 %
NEUTROS PCT: 45 %
NRBC: 0 % (ref 0.0–0.2)
Neutro Abs: 4.1 10*3/uL (ref 1.7–7.7)
Platelets: 354 10*3/uL (ref 150–400)
RBC: 3.87 MIL/uL (ref 3.87–5.11)
RDW: 12.9 % (ref 11.5–15.5)
WBC: 9.2 10*3/uL (ref 4.0–10.5)

## 2018-06-06 LAB — HEMOGLOBIN A1C
HEMOGLOBIN A1C: 7.6 % — AB (ref 4.8–5.6)
Mean Plasma Glucose: 171.42 mg/dL

## 2018-06-06 LAB — COMPREHENSIVE METABOLIC PANEL
ALBUMIN: 3.6 g/dL (ref 3.5–5.0)
ALK PHOS: 66 U/L (ref 38–126)
ALT: 34 U/L (ref 0–44)
AST: 34 U/L (ref 15–41)
Anion gap: 6 (ref 5–15)
BUN: 11 mg/dL (ref 8–23)
CALCIUM: 8.7 mg/dL — AB (ref 8.9–10.3)
CHLORIDE: 108 mmol/L (ref 98–111)
CO2: 27 mmol/L (ref 22–32)
CREATININE: 0.71 mg/dL (ref 0.44–1.00)
GFR calc Af Amer: >60 mL/min (ref >60)
GFR calc non Af Amer: >60 mL/min (ref >60)
Glucose, Bld: 142 mg/dL — ABNORMAL HIGH (ref 70–99)
Potassium: 4.1 mmol/L (ref 3.5–5.1)
SODIUM: 141 mmol/L (ref 135–145)
Total Bilirubin: 0.4 mg/dL (ref 0.3–1.2)
Total Protein: 7.2 g/dL (ref 6.5–8.1)

## 2018-06-06 LAB — RETICULOCYTES
Immature Retic Fract: 12.2 % (ref 2.3–15.9)
RBC.: 3.87 MIL/uL (ref 3.87–5.11)
RETIC COUNT ABSOLUTE: 73.9 10*3/uL (ref 19.0–186.0)
Retic Ct Pct: 1.9 % (ref 0.4–3.1)

## 2018-06-06 LAB — FOLATE: Folate: 8.3 ng/mL (ref >5.9)

## 2018-06-06 LAB — IRON AND TIBC
IRON: 59 ug/dL (ref 28–170)
Saturation Ratios: 21 % (ref 10.4–31.8)
TIBC: 278 ug/dL (ref 250–450)
UIBC: 219 ug/dL

## 2018-06-06 LAB — MAGNESIUM: Magnesium: 2 mg/dL (ref 1.7–2.4)

## 2018-06-06 LAB — PHOSPHORUS: Phosphorus: 3.1 mg/dL (ref 2.5–4.6)

## 2018-06-06 LAB — VITAMIN B12: VITAMIN B 12: 239 pg/mL (ref 180–914)

## 2018-06-06 LAB — GLUCOSE, CAPILLARY
GLUCOSE-CAPILLARY: 187 mg/dL — AB (ref 70–99)
Glucose-Capillary: 129 mg/dL — ABNORMAL HIGH (ref 70–99)

## 2018-06-06 LAB — FERRITIN: Ferritin: 195 ng/mL (ref 11–307)

## 2018-06-06 MED ORDER — HYDROCOD POLST-CPM POLST ER 10-8 MG/5ML PO SUER: 5.0000 mL | Freq: Two times a day (BID) | ORAL | 0 refills | Status: DC | PRN

## 2018-06-06 MED ORDER — VITAMINS A & D EX OINT: TOPICAL_OINTMENT | CUTANEOUS | 0 refills | Status: DC | PRN

## 2018-06-06 MED ORDER — METOPROLOL TARTRATE 25 MG PO TABS: 12.5000 mg | ORAL_TABLET | Freq: Two times a day (BID) | ORAL | 0 refills | Status: DC

## 2018-06-06 MED ORDER — DEXTROMETHORPHAN POLISTIREX ER 30 MG/5ML PO SUER: 30.0000 mg | Freq: Two times a day (BID) | ORAL | 0 refills | Status: DC | PRN

## 2018-06-06 MED ORDER — LOSARTAN POTASSIUM 50 MG PO TABS: 50.0000 mg | ORAL_TABLET | Freq: Every day | ORAL | 11 refills | Status: DC

## 2018-06-06 NOTE — Care Management Note (Signed)
Case Management Note  Patient Details  Name: Ardine Iacovelli MRN: [id number] Date of Birth: November 26, 1955  Subjective/Objective:  PT/OT-no f/u. Has family support. No CM needs.                  Action/Plan:d/c home.   Expected Discharge Date:  06/06/18               Expected Discharge Plan:  Home/Self Care  In-House Referral:     Discharge planning Services  CM Consult  Post Acute Care Choice:    Choice offered to:     DME Arranged:    DME Agency:     HH Arranged:    HH Agency:     Status of Service:  Completed, signed off  If discussed at [employer] of Stay Meetings, dates discussed:    Additional Comments:  Lanier Clam, RN 06/06/2018, 12:10 PM

## 2018-06-06 NOTE — Discharge Summary (Signed)
Physician Discharge Summary  Madeline Bell HKV:[id number] DOB: [birth year] DOA: 06/03/2018  PCP: Doristine Bosworth, MD  Admit date: 06/03/2018 Discharge date: 06/06/2018  Admitted From: Home Disposition: Home  Recommendations for Outpatient Follow-up:  1. Follow up with PCP in 1-2 weeks 2. Follow up with Pulmonary for Chronic Cough with Dr. Sherene Sires; Appointment scheduled for you 3. Follow up with Gastroenterology Dr. Barron Alvine as an outpatient  4. Follow up with Cardiology for outpatient Event Monitor and ECHO 5. Please obtain CMP/CBC, Mag, Phos in one week 6. Please follow up on the following pending results:  Home Health: No Equipment/Devices: None recommended by PT   Discharge Condition: Stable CODE STATUS: FULL CODE Diet recommendation: Heart Healthy Diet  Brief/Interim Summary: Madeline Bell a 62 y.o.femalewithhistory of hypertension, diabetes mellitus type 2 was brought to the ER after patient had a loss of consciousness. Per the family patient has been having recurrent episodes of syncope after coughing spells. Had recently gone to urgent care center and was told that she has increased fluid and was given Lasix. History evening around 5 PM patient had bouts of cough following which patient lost consciousness and hit her face onto the floor and lacerated her lip. Patient was brought to the ER. Per the family patient regained consciousness within few seconds each time. Has not had any chest pain shortness of breath palpitations nausea vomiting headache visual symptoms or any incontinence of urine or bowel.  She underwent further testing and had an esophagram which was unremarkable.  She improved and cough improved with antitussive medications.  She is deemed medically stable to [place] discharged at this time and will need to follow-up with primary care, as well as pulmonology, gastro enterology as well as cardiology in outpatient setting.  Discharge Diagnoses:  Principal Problem:    Syncope Active Problems:   HTN (hypertension), benign   LBBB (left bundle branch block)  Syncope -likely cough mediated syncope -Continue on IV fluid hydration with normal saline at 75 mL's per hour for now -? If cough is related to aspiration - obtained SLP eval and recommended Esophagram; See below -Pantoprazole 40 mg p.o. daily and will continue -Troponin negative -D/C'd ARB-HCTZ combination and replaced with Metoprolol as she is allergic to Norvasc and Hydralazine; Resumed Valsartan without HCTZ and continued Metoprolol 12.5 mg po BID at D/C  -CT head unrevealing -ECHO shows only grade 1 d CHF -PT/OT to evaluate and treat and recommend no follow up  -Follow up with Cardiology for Event Monitor and outpatient further evaluation  -Follow up with PCP within 1 week   Chronic Cough -Obtaining SLP evaluation and they have recommended an esophagram which is been ordered -Now patient is n.p.o. till esophagram is being done -Continue with pantoprazole 40 g p.o. daily -Continue with cough suppression with Dostinex and Delsym as needed -Patient had recent EGD done for Esophageal Dysphagia and was reviewed and showed: LA Grade A reflux esophagitis. Esophagogastric landmarks identified as above. 4 cm hiatal hernia. Normal upper third of esophagus and middle third of esophagus. Dilated. Gastroesophageal flap valve classified as Hill Grade IV (no fold, wide open lumen, hiatal hernia present). Gastritis. Biopsied. Erythematous duodenopathy. Biopsied. Normal second portion of the duodenum. Biopsied. -Esophagram findings showed that it was negative for Stricture or other obstructive process and there was no specific esophageal dysmotility but there was some stasis of contrast associated with tertiary contractions. There was a Hiatal Hernia noted on recent EGD that was not visualized when standing  -Continued Aspiration precautions  -  Outpatient Referral to Pulmonary  -Follow up with GI as an outpatient  as well  Hyponatremia/ Hyperchloremia, improved -Improved. -Patient's sodium level went from 134 and is now 141 -Patient's chloride level went from 95 is now 108 -Continue to monitor and continued with IV fluid hydration for now  -Repeat CMP as an outpatient   Leukocytosis, improved  -? Stress -no source of infection noted -Improved now his WBC went from 13.6 now 9.2 -Continue to Monitor for S/Sx of Infection and repeat CBC as an outpatient   HTN (hypertension), benign -see above -D/C'd ARB-HTCZ and added Metoprolol while hospitalzied -Resume ARB at D/C without HCTZ and reduce Metoprolol to 12.5 mg po BID   LBBB (left bundle branch block) -Chronic -Continue to Monitor on Telemetry  -Follow up with Cardiology as an outpatient   DM2 -Hold Metformin 1000 mg po BID  -CBGs have been ranging from 121-287 -Continue with sensitive sliding scale NovoLog insulin while hospitalized  -Checked HbA1c and was 7.6 -Follow up with PCP   GERD -Continue with PPI  -Follow up with Gastroenterology as an outpatient   Back Pain -In the setting of coughing incident -Checked lumbar x-ray and showed Osteopenia and lower lumbar spine degenerative spondylosis. No acute finding by plain radiography. Atherosclerosis -Stable  Normocytic Anemia -Hb/Hct went from 11.5/34.7 -> 10.4/32.8 -> 10.1/32.5 -Suspect dilutional drop IV fluid hydration -Check anemia panel in a.m. -Continue monitor for signs and symptoms of bleeding -Repeat CBC in the a.m.  Obesity -Estimated body mass index is 31.2 kg/m as calculated from the following:   Height as of this encounter: 5\' 1"  (1.549 m).   Weight as of this encounter: 74.9 kg. -Weight Loss Counseling given   Discharge Instructions  Discharge Instructions    Call MD for:  difficulty breathing, headache or visual disturbances   Complete by:  As directed    Call MD for:  extreme fatigue   Complete by:  As directed    Call MD for:  hives    Complete by:  As directed    Call MD for:  persistant dizziness or light-headedness   Complete by:  As directed    Call MD for:  persistant nausea and vomiting   Complete by:  As directed    Call MD for:  redness, tenderness, or signs of infection (pain, swelling, redness, odor or green/yellow discharge around incision site)   Complete by:  As directed    Call MD for:  severe uncontrolled pain   Complete by:  As directed    Call MD for:  temperature >100.4   Complete by:  As directed    Diet - low sodium heart healthy   Complete by:  As directed    Discharge instructions   Complete by:  As directed    You were cared for by a hospitalist during your hospital stay. If you have any questions about your discharge medications or the care you received while you were in the hospital after you are discharged, you can call the unit and ask to speak with the hospitalist on call if the hospitalist that took care of you is not available. Once you are discharged, your primary care physician will handle any further medical issues. Please note that NO REFILLS for any discharge medications will [place] authorized once you are discharged, as it is imperative that you return to your primary care physician (or establish a relationship with a primary care physician if you do not have one) for your aftercare  needs so that they can reassess your need for medications and monitor your lab values.  Follow up with PCP, GI, and Pulmonary as an outpatient. Take all medications as prescribed. If symptoms change or worsen please return to the ED for evaluation   Increase activity slowly   Complete by:  As directed      Allergies as of 06/06/2018      Reactions   Tylenol [acetaminophen] Rash   Amlodipine Besylate Rash   Lisinopril-hydrochlorothiazide    Bactrim [sulfamethoxazole-trimethoprim]    Rash    Cephalexin    Doxycycline    Rash and itching and lips feel burnt, no anaphylaxis   Gentamicin Sulfate    Nsaids  Rash   Penicillins Rash, Other (See Comments)   Burning  Has patient had a PCN reaction causing immediate rash, facial/tongue/throat swelling, SOB or lightheadedness with hypotension: Yes Has patient had a PCN reaction causing severe rash involving mucus membranes or skin necrosis: No Has patient had a PCN reaction that required hospitalization: No Has patient had a PCN reaction occurring within the last 10 years: No If all of the above answers are "NO", then may proceed with Cephalosporin use.   Tetracyclines & Related Rash      Medication List    STOP taking these medications   losartan-hydrochlorothiazide 50-12.5 MG tablet Commonly known as:  HYZAAR     TAKE these medications   bisacodyl 5 MG EC tablet Commonly known as:  DULCOLAX Take 5 mg by mouth once. Notes to patient:  Resume home regimen    chlorpheniramine-HYDROcodone 10-8 MG/5ML Suer Commonly known as:  TUSSIONEX Take 5 mLs by mouth every 12 (twelve) hours as needed for cough (if guaifenesin not effective). Or if Delsym is not effective.   dextromethorphan 30 MG/5ML liquid Commonly known as:  DELSYM Take 5 mLs (30 mg total) by mouth 2 (two) times daily as needed for cough.   FREESTYLE LIBRE 14 DAY READER Devi 1 Device by Does not apply route QID. Use to check blood glucose 3 times a day Notes to patient:  Resume home regimen    FREESTYLE LIBRE 14 DAY SENSOR Misc 1 Device by Does not apply route every 14 (fourteen) days. Notes to patient:  Resume home regimen    glucose blood test strip Use as instructed   loratadine 10 MG tablet Commonly known as:  CLARITIN TAKE 1 TABLET BY MOUTH EVERY DAY   losartan 50 MG tablet Commonly known as:  COZAAR Take 1 tablet (50 mg total) by mouth daily.   metFORMIN 1000 MG tablet Commonly known as:  GLUCOPHAGE Take 1 tablet (1,000 mg total) by mouth 2 (two) times daily with a meal. Notes to patient:  Resume home regimen    metoprolol tartrate 25 MG tablet Commonly known  as:  LOPRESSOR Take 0.5 tablets (12.5 mg total) by mouth 2 (two) times daily.   MIRALAX powder Generic drug:  polyethylene glycol powder Take 1 Container by mouth once.   omeprazole 20 MG capsule Commonly known as:  PRILOSEC Take 1 capsule (20 mg total) by mouth 2 (two) times daily. Notes to patient:  Resume home regimen    vitamin A & D ointment Apply topically as needed for lip care.   VITAMIN C PO Take 1 tablet by mouth daily. Notes to patient:  Resume home regimen       Follow-up Information    Schedule an appointment as soon as possible for a visit  with [facility].   Why:  in the next 7-14 days for outpatient echocardiogram and cardiac event monitoring Contact information: [address] [place] [address] [phone]       Doristine Bosworth, MD. Call.   Specialty:  Internal Medicine Why:  Follow up as an outpatient within 1 week  Contact information: [address] [place] [address] [phone]        Nyoka Cowden, MD. Go to.   Specialty:  Pulmonary Disease Why:  Appointment scheduled for you November 5th at 10:30 AM for chronic cough.  Contact information: [address] [place] [address] [phone]        Shellia Cleverly, DO. Call.   Specialty:  Gastroenterology Why:  Follow up within 1-2 weeks Contact information: [address] [phone]          Allergies  Allergen Reactions  . Tylenol [Acetaminophen] Rash  . Amlodipine Besylate Rash  . Lisinopril-Hydrochlorothiazide   . Bactrim [Sulfamethoxazole-Trimethoprim]     Rash   . Cephalexin   . Doxycycline     Rash and itching and lips feel burnt, no anaphylaxis  . Gentamicin Sulfate   . Nsaids Rash  . Penicillins Rash and Other (See Comments)    Burning   Has patient had a PCN reaction causing immediate rash, facial/tongue/throat swelling, SOB or lightheadedness with  hypotension: Yes Has patient had a PCN reaction causing severe rash involving mucus membranes or skin necrosis: No Has patient had a PCN reaction that required hospitalization: No Has patient had a PCN reaction occurring within the last 10 years: No If all of the above answers are "NO", then may proceed with Cephalosporin use.   . Tetracyclines & Related Rash   Consultations:  None  Procedures/Studies: Dg Chest 2 View  Result Date: 06/03/2018 CLINICAL DATA:  Cough EXAM: CHEST - 2 VIEW COMPARISON:  05/28/2018 FINDINGS: Mild cardiomegaly. Lungs clear. No effusions. No acute bony abnormality. IMPRESSION: Mild cardiomegaly.  No active disease. Electronically Signed   By: Charlett Nose M.D.   On: 06/03/2018 21:21   Dg Chest 2 View  Result Date: 05/28/2018 CLINICAL DATA:  Five months of occasionally productive cough. History of sickle cell anemia, diabetes, hypertension. EXAM: CHEST - 2 VIEW COMPARISON:  PA and lateral chest x-ray of February 23, 2018 FINDINGS: The lungs are mildly hypoinflated. There is respiratory motion artifact on the lateral view. The lung markings are mildly prominent in the retrocardiac region but a discrete infiltrate is not observed. The cardiac silhouette is enlarged. The pulmonary vascularity is not engorged. There is calcification in the wall of the aortic arch. The bony thorax is unremarkable. IMPRESSION: Mild bilateral hypoinflation. Mild enlargement of cardiac silhouette with mild pulmonary vascular prominence and an increase in the interstitial markings consistent with low-grade CHF. No alveolar pneumonia. Thoracic aortic atherosclerosis. Electronically Signed   By: David  [place] M.D.   On: 05/28/2018 11:13   Ct Head Wo Contrast  Result Date: 06/03/2018 CLINICAL DATA:  62 y/o  F; trip and fall. Laceration of upper lip. EXAM: CT HEAD WITHOUT CONTRAST TECHNIQUE: Contiguous axial images were obtained from the base of the skull through the vertex without intravenous  contrast. COMPARISON:  10/19/2017 CT head. FINDINGS: Brain: No evidence of acute infarction, hemorrhage, hydrocephalus, extra-axial collection or mass lesion/mass effect. Stable mild chronic microvascular ischemic changes and volume loss of the brain. Vascular: Calcific atherosclerosis of carotid siphons. Skull: Normal. Negative for fracture or focal lesion.  Sinuses/Orbits: No acute finding. Other: None. IMPRESSION: 1. No acute intracranial abnormality or calvarial fracture. 2. Stable mild chronic microvascular ischemic changes and volume loss of the brain. Electronically Signed   By: Mitzi Hansen M.D.   On: 06/03/2018 21:48   Dg Esophagus  Result Date: 06/05/2018 CLINICAL DATA:  Cough and dysphagia.  Concern for reflux EXAM: ESOPHOGRAM/BARIUM SWALLOW TECHNIQUE: Single contrast examination was performed using  thin barium. FLUOROSCOPY TIME:  Fluoroscopy Time:  0.9 minutes Radiation Exposure Index (if provided by the fluoroscopic device): 7 mGy Number of Acquired Spot Images: 0 COMPARISON:  None. FINDINGS: The patient is undergoing telemetry and running fluids. Single contrast study was performed when standing. Patient's daughter was available for interpretation. Oblique pharyngeal imaging shows no aspiration or obstructive process. No diverticulum. The esophagus has smooth normal mucosal contour. No stricture or mass is seen. A 13 mm barium tablet easily traversed the esophagus. No specific esophageal dysmotility but there was some stasis of contrast associated with tertiary contractions. IMPRESSION: 1. Negative for stricture or other obstructive process. 2. Nonspecific esophageal dysmotility. 3. Hiatal hernia noted on recent EGD is not visualized when standing. Electronically Signed   By: Marnee Spring M.D.   On: 06/05/2018 15:20   Dg Lumbar Spine 1 View  Result Date: 06/05/2018 CLINICAL DATA:  Low back pain EXAM: LUMBAR SPINE - 1 VIEW COMPARISON:  06/03/2018 FINDINGS: Single lateral view  demonstrates anatomic alignment. Bones are osteopenic. Preserved vertebral body heights. No acute compression fracture, wedge-shaped deformity or focal kyphosis. Minor endplate bony spurring and degenerative disc disease at L4-5 and L5-S1. Slight facet arthropathy at L5-S1. Aorta atherosclerotic. IMPRESSION: Osteopenia and lower lumbar spine degenerative spondylosis. No acute finding by plain radiography Atherosclerosis Electronically Signed   By: Judie Petit.  Shick M.D.   On: 06/05/2018 15:12    ECHOCARDIOGRAM ------------------------------------------------------------------- Study Conclusions  - Left ventricle: The cavity size was normal. Wall thickness was   normal. Systolic function was normal. The estimated ejection   fraction was in the range of 60% to 65%. Wall motion was normal;   there were no regional wall motion abnormalities. Doppler   parameters are consistent with abnormal left ventricular   relaxation (grade 1 diastolic dysfunction). Doppler parameters   are consistent with elevated mean left atrial filling pressure. - Left atrium: The atrium was mildly dilated. - Pericardium, extracardiac: A trivial pericardial effusion was   identified.  Subjective: And examined at the bedside with the assistance of her son who translated along with the nurse.  Patient felt well and had no complaints.  No nausea or vomiting.  Had no more coughing episodes.  She wanted to go home and felt well.  Discharge Exam: Vitals:   06/06/18 0900 06/06/18 1125  BP: (!) 175/88   Pulse: 73   Resp:    Temp:    SpO2: 97% 97%   Vitals:   06/05/18 2108 06/06/18 0524 06/06/18 0900 06/06/18 1125  BP: (!) 154/86 133/78 (!) 175/88   Pulse: 73 66 73   Resp: 17 16    Temp: 98.3 F (36.8 C) 97.8 F (36.6 C)    TempSrc: Oral Oral    SpO2: 98% 98% 97% 97%  Weight:      Height:       General: Pt is alert, awake, not in acute distress Cardiovascular: RRR, S1/S2 +, no rubs, no gallops Respiratory: Slightly  diminished bilaterally, no wheezing, no rhonchi Abdominal: Soft, NT, ND, bowel sounds + Extremities: no edema, no cyanosis  The results  of significant diagnostics from this hospitalization (including imaging, microbiology, ancillary and laboratory) are listed below for reference.    Microbiology: No results found for this or any previous visit (from the past 240 hour(s)).   Labs: BNP (last 3 results) No results for input(s): BNP in the last 8760 hours. Basic Metabolic Panel: Recent Labs  Lab 06/03/18 2340 06/04/18 0351 06/05/18 0548 06/06/18 0538  NA 133* 134* 139 141  K 3.9 3.8 4.2 4.1  CL 95* 95* 104 108  CO2 27 28 26 27   GLUCOSE 129* 140* 125* 142*  BUN 16 15 14 11   CREATININE 0.76 0.71 0.75 0.71  CALCIUM 9.4 9.2 8.8* 8.7*  MG  --  2.0 2.1 2.0  PHOS  --   --  3.8 3.1   Liver Function Tests: Recent Labs  Lab 06/04/18 0351 06/06/18 0538  AST 24 34  ALT 29 34  ALKPHOS 66 66  BILITOT 0.8 0.4  PROT 7.7 7.2  ALBUMIN 3.9 3.6   No results for input(s): LIPASE, AMYLASE in the last 168 hours. No results for input(s): AMMONIA in the last 168 hours. CBC: Recent Labs  Lab 06/03/18 2340 06/04/18 0351 06/05/18 0548 06/06/18 0538  WBC 13.6* 11.1* 8.3 9.2  NEUTROABS 7.7  --   --  4.1  HGB 11.5* 11.2* 10.4* 10.1*  HCT 34.7* 34.8* 32.8* 32.5*  MCV 79.8* 80.7 82.8 84.0  PLT 404* 377 374 354   Cardiac Enzymes: Recent Labs  Lab 06/04/18 0351 06/04/18 0904 06/04/18 1440  TROPONINI <0.03 <0.03 <0.03   BNP: Invalid input(s): POCBNP CBG: Recent Labs  Lab 06/05/18 1151 06/05/18 1747 06/05/18 2107 06/06/18 0748 06/06/18 1202  GLUCAP 287* 149* 215* 129* 187*   D-Dimer No results for input(s): DDIMER in the last 72 hours. Hgb A1c Recent Labs    06/06/18 0538  HGBA1C 7.6*   Lipid Profile No results for input(s): CHOL, HDL, LDLCALC, TRIG, CHOLHDL, LDLDIRECT in the last 72 hours. Thyroid function studies Recent Labs    06/04/18 0351  TSH 2.189   Anemia  work up Recent Labs    06/06/18 0538  VITAMINB12 239  FOLATE 8.3  FERRITIN 195  TIBC 278  IRON 59  RETICCTPCT 1.9   Urinalysis    Component Value Date/Time   COLORURINE STRAW (A) 06/04/2018 0414   APPEARANCEUR CLEAR 06/04/2018 0414   LABSPEC 1.004 (L) 06/04/2018 0414   PHURINE 7.0 06/04/2018 0414   GLUCOSEU NEGATIVE 06/04/2018 0414   HGBUR NEGATIVE 06/04/2018 0414   BILIRUBINUR NEGATIVE 06/04/2018 0414   BILIRUBINUR negative 04/10/2018 0854   KETONESUR NEGATIVE 06/04/2018 0414   PROTEINUR NEGATIVE 06/04/2018 0414   UROBILINOGEN 0.2 04/10/2018 0854   NITRITE NEGATIVE 06/04/2018 0414   LEUKOCYTESUR TRACE (A) 06/04/2018 0414   Sepsis Labs Invalid input(s): PROCALCITONIN,  WBC,  LACTICIDVEN Microbiology No results found for this or any previous visit (from the past 240 hour(s)).  Time coordinating discharge: 35 minutes  SIGNED:  Merlene Laughter, DO [facility] 06/06/2018, 5:36 PM Pager is on AMION  If 7PM-7AM, please contact night-coverage [email or web address] Password [email or web address]

## 2018-06-06 NOTE — Evaluation (Signed)
Occupational Therapy Evaluation Patient Details Name: Madeline Bell MRN: [id number] DOB: Dec 08, 1955 Today's Date: 06/06/2018    History of Present Illness 62 yo female admitted with syncope/collapse. Hx of CHF, DM, dysphagia, anemia, LBBB, HTN. Pt is non [place] speaking.    Clinical Impression   Pt was seen for OT assessment with son translating and present throughout. Pt is overall Mod I ADL's and reports that family is available to assist PRN at d/c. Pt lives with husband & adult children. No further acute OT needs identified at this time, will sign off.    Follow Up Recommendations  No OT follow up;Supervision - Intermittent    Equipment Recommendations  None recommended by OT    Recommendations for Other Services       Precautions / Restrictions Precautions Precautions: Fall Restrictions Weight Bearing Restrictions: No      Mobility Bed Mobility Overal bed mobility: Independent                Transfers Overall transfer level: Independent                    Balance Overall balance assessment: Mild deficits observed, not formally tested                                         ADL either performed or assessed with clinical judgement   ADL Overall ADL's : Needs assistance/impaired Eating/Feeding: Modified independent;Sitting   Grooming: Wash/dry hands;Wash/dry face;Oral care;Standing;Modified independent   Upper Body Bathing: Standing;Independent   Lower Body Bathing: Sit to/from stand;Modified independent   Upper Body Dressing : Standing;Independent Upper Body Dressing Details (indicate cue type and reason): Donned gown Lower Body Dressing: Sit to/from stand;Sitting/lateral leans;Modified independent   Toilet Transfer: Modified Independent;Ambulation;Regular Toilet   Toileting- Clothing Manipulation and Hygiene: Modified independent;Sit to/from stand;Sitting/lateral lean       Functional mobility during ADLs: Modified  independent General ADL Comments: Pt was seen for OT assessment with son translating and present throughout. Pt is overall Mod I ADL's and reports that family is available to assist PRN at d/c. Pt lives with husband & adult children. No further acute OT needs identified at this time, will sign off.     Vision Baseline Vision/History: Wears glasses Wears Glasses: Reading only Patient Visual Report: No change from baseline       Perception     Praxis      Pertinent Vitals/Pain Pain Assessment: No/denies pain Pain Score: 0-No pain Faces Pain Scale: No hurt     Hand Dominance Right   Extremity/Trunk Assessment Upper Extremity Assessment Upper Extremity Assessment: Overall WFL for tasks assessed   Lower Extremity Assessment Lower Extremity Assessment: Defer to PT evaluation;Overall [facility] for tasks assessed   Cervical / Trunk Assessment Cervical / Trunk Assessment: Normal   Communication Communication Communication: Prefers language other than English(Son provided trasnlation for pt, was present throughout session.)   Cognition Arousal/Alertness: Awake/alert Behavior During Therapy: WFL for tasks assessed/performed Overall Cognitive Status: Within Functional Limits for tasks assessed                                     General Comments       Exercises     Shoulder Instructions      Home Living Family/patient expects to Notnamed discharged to:: Private  residence Living Arrangements: Children Available Help at Discharge: Family Type of Home: House Home Access: Stairs to enter Secretary/administrator of Steps: 3   Home Layout: One level     Bathroom Shower/Tub: Chief Strategy Officer: Standard     Home Equipment: None   Additional Comments: Son reports that pt has a cane but does not use      Prior Functioning/Environment Level of Independence: Independent                 OT Problem List:        OT Treatment/Interventions:       OT Goals(Current goals can [name] found in the care plan section) Acute Rehab OT Goals Patient Stated Goal: Per son, pt is hoping to d/c home today if able OT Goal Formulation: All assessment and education complete, DC therapy  OT Frequency:     Barriers to D/C:            Co-evaluation              AM-PAC PT "6 Clicks" Daily Activity     Outcome Measure Help from another person eating meals?: None Help from another person taking care of personal grooming?: None Help from another person toileting, which includes using toliet, bedpan, or urinal?: None Help from another person bathing (including washing, rinsing, drying)?: None Help from another person to put on and taking off regular upper body clothing?: None Help from another person to put on and taking off regular lower body clothing?: None 6 Click Score: 24   End of Session Equipment Utilized During Treatment: Gait belt Nurse Communication: Mobility status;Other (comment)(No further OT needs )  Activity Tolerance: Patient tolerated treatment well Patient left: in chair;with call bell/phone within reach;with family/visitor present                   Time: 4696-2952 OT Time Calculation (min): 20 min Charges:  OT General Charges $OT Visit: 1 Visit OT Evaluation $OT Eval Low Complexity: 1 Low    Barnhill, Amy Beth Dixon, OTR/L 06/06/2018, 8:35 AM

## 2018-06-07 ENCOUNTER — Other Ambulatory Visit: Payer: Self-pay | Admitting: Family Medicine

## 2018-06-07 DIAGNOSIS — R059 Cough, unspecified: Secondary | ICD-10-CM

## 2018-06-07 DIAGNOSIS — R05 Cough: Secondary | ICD-10-CM

## 2018-06-07 NOTE — Telephone Encounter (Signed)
Patient was discharged from hospital today, was asked to follow up Cardiology for an event monitor;  She is currently scheduled on 10/31 at [facility]- Do you want to cancel until cardiology evaluation is completed????

## 2018-06-07 NOTE — Telephone Encounter (Signed)
Discussed with Dr. Barron Alvine - patient to [facility] rescheduled until after cardiac evaluation -Nov. 20th;  Daughter asked to contact office with recommendations from cardiology; will mail patient new instructions for Dec.10th

## 2018-06-07 NOTE — Telephone Encounter (Signed)
Agreed. Will plan on completing after Cardiology eval. Thanks for coordinating!

## 2018-06-07 NOTE — Telephone Encounter (Signed)
Requested Prescriptions  Pending Prescriptions Disp Refills  . loratadine (CLARITIN) 10 MG tablet [Pharmacy Med Name: LORATADINE 10 MG TABLET] 90 tablet 0    Sig: TAKE 1 TABLET BY MOUTH EVERY DAY     Ear, Nose, and Throat:  Antihistamines Passed - 06/07/2018  1:59 AM      Passed - Valid encounter within last 12 months    Recent Outpatient Visits          1 month ago Type 2 diabetes mellitus with hyperglycemia, without long-term current use of insulin (HCC)   Primary Care at [facility], Manus Rudd, MD   3 months ago Essential hypertension   Primary Care at [facility], Asencion Partridge, MD   3 months ago Essential hypertension   Primary Care at [facility], Manus Rudd, MD   3 months ago Cough   Primary Care at [facility], Asencion Partridge, MD   3 months ago Uncontrolled hypertension   Primary Care at [facility], Manus Rudd, MD      Future Appointments            In 2 weeks Doristine Bosworth, MD Primary Care at [place], [facility]

## 2018-06-13 ENCOUNTER — Encounter: Payer: Self-pay | Admitting: Gastroenterology

## 2018-06-18 ENCOUNTER — Institutional Professional Consult (permissible substitution): Payer: Self-pay | Admitting: Internal Medicine

## 2018-06-18 ENCOUNTER — Other Ambulatory Visit: Payer: Self-pay | Admitting: Family Medicine

## 2018-06-24 ENCOUNTER — Encounter: Payer: Self-pay | Admitting: Family Medicine

## 2018-06-24 ENCOUNTER — Ambulatory Visit: Payer: Self-pay | Admitting: Family Medicine

## 2018-06-24 ENCOUNTER — Other Ambulatory Visit: Payer: Self-pay

## 2018-06-24 VITALS — BP 126/78 | HR 75 | Temp 98.0°F | Resp 16 | Ht 61.0 in | Wt 166.6 lb

## 2018-06-24 DIAGNOSIS — R55 Syncope and collapse: Secondary | ICD-10-CM

## 2018-06-24 DIAGNOSIS — R05 Cough: Secondary | ICD-10-CM

## 2018-06-24 DIAGNOSIS — K449 Diaphragmatic hernia without obstruction or gangrene: Secondary | ICD-10-CM

## 2018-06-24 DIAGNOSIS — R058 Other specified cough: Secondary | ICD-10-CM

## 2018-06-24 DIAGNOSIS — Z1239 Encounter for other screening for malignant neoplasm of breast: Secondary | ICD-10-CM

## 2018-06-24 DIAGNOSIS — T464X5A Adverse effect of angiotensin-converting-enzyme inhibitors, initial encounter: Secondary | ICD-10-CM

## 2018-06-24 DIAGNOSIS — I447 Left bundle-branch block, unspecified: Secondary | ICD-10-CM

## 2018-06-24 DIAGNOSIS — Z23 Encounter for immunization: Secondary | ICD-10-CM

## 2018-06-24 NOTE — Progress Notes (Signed)
Chief Complaint  Patient presents with  . Diabetes    follow up  . Medication Refill    tussionex    HPI   Hospital Follow Up  Patient was admitted to the hospital for work up for syncope Per her daughter she cough until she fainted She reports that she has been having a cough for a few months She denies fevers or chills The cough was very dry When she was admitted on 06/03/18 she had a full work up Per hospital reports: she had an ECHO which showed mild CHF. Her ECG showed LBBB without previous ECG for comparison. She had CT head without acute findings. Her labs showed mild anemia. No deficiency of electrolytes, calcium, folate or B12 and her a1c was in a good range.  She was on lisinopril which was discontinued and she was started on metoprolol and losartan She was referred to Cardiology for an event monitor  Today she is still on dylsum and tussionex She reports that she has much less coughing She reports that she is coughing less but wants more of the cough meds No fevers Cough remains nonproductive She is taking all her meds     Past Medical History:  Diagnosis Date  . CHF (congestive heart failure) (HCC)   . Diabetes mellitus without complication (HCC)   . Dysphagia    esophageal dysphagia  . GERD (gastroesophageal reflux disease)   . Heart murmur   . Hiatal hernia 03/26/2018   EGD 03/26/18 shows gastritis and hiatal hernia 4cm  . Hypertension   . Sickle cell anemia (HCC)    trait    Current Outpatient Medications  Medication Sig Dispense Refill  . Ascorbic Acid (VITAMIN C PO) Take 1 tablet by mouth daily.     . bisacodyl (DULCOLAX) 5 MG EC tablet Take 5 mg by mouth once.    . chlorpheniramine-HYDROcodone (TUSSIONEX) 10-8 MG/5ML SUER Take 5 mLs by mouth every 12 (twelve) hours as needed for cough (if guaifenesin not effective). Or if Delsym is not effective. 1 Bottle 0  . Continuous Blood Gluc Receiver (FREESTYLE LIBRE 14 DAY READER) DEVI 1 Device by Does not  apply route QID. Use to check blood glucose 3 times a day 1 Device 0  . Continuous Blood Gluc Sensor (FREESTYLE LIBRE 14 DAY SENSOR) MISC 1 Device by Does not apply route every 14 (fourteen) days. 2 each 6  . dextromethorphan (DELSYM) 30 MG/5ML liquid Take 5 mLs (30 mg total) by mouth 2 (two) times daily as needed for cough. 89 mL 0  . glucose blood test strip Use as instructed 100 each 12  . loratadine (CLARITIN) 10 MG tablet TAKE 1 TABLET BY MOUTH EVERY DAY (Patient taking differently: Take 10 mg by mouth daily. ) 30 tablet 1  . loratadine (CLARITIN) 10 MG tablet TAKE 1 TABLET BY MOUTH EVERY DAY 90 tablet 0  . losartan (COZAAR) 50 MG tablet Take 1 tablet (50 mg total) by mouth daily. 30 tablet 11  . metFORMIN (GLUCOPHAGE) 1000 MG tablet Take 1 tablet (1,000 mg total) by mouth 2 (two) times daily with a meal. 60 tablet 3  . metoprolol tartrate (LOPRESSOR) 25 MG tablet Take 0.5 tablets (12.5 mg total) by mouth 2 (two) times daily. 60 tablet 0  . omeprazole (PRILOSEC) 20 MG capsule Take 1 capsule (20 mg total) by mouth 2 (two) times daily. 30 capsule 3  . polyethylene glycol powder (MIRALAX) powder Take 1 Container by mouth once.     No  current facility-administered medications for this visit.     Allergies:  Allergies  Allergen Reactions  . Tylenol [Acetaminophen] Rash  . Amlodipine Besylate Rash  . Lisinopril-Hydrochlorothiazide   . Bactrim [Sulfamethoxazole-Trimethoprim]     Rash   . Cephalexin   . Doxycycline     Rash and itching and lips feel burnt, no anaphylaxis  . Gentamicin Sulfate   . Nsaids Rash  . Penicillins Rash and Other (See Comments)    Burning   Has patient had a PCN reaction causing immediate rash, facial/tongue/throat swelling, SOB or lightheadedness with hypotension: Yes Has patient had a PCN reaction causing severe rash involving mucus membranes or skin necrosis: No Has patient had a PCN reaction that required hospitalization: No Has patient had a PCN reaction  occurring within the last 10 years: No If all of the above answers are "NO", then may proceed with Cephalosporin use.   . Tetracyclines & Related Rash    Past Surgical History:  Procedure Laterality Date  . UPPER GASTROINTESTINAL ENDOSCOPY  03/26/2018    Social History   Socioeconomic History  . Marital status: Married    Spouse name: Not on file  . Number of children: Not on file  . Years of education: Not on file  . Highest education level: Not on file  Occupational History  . Not on file  Social Needs  . Financial resource strain: Not on file  . Food insecurity:    Worry: Not on file    Inability: Not on file  . Transportation needs:    Medical: Not on file    Non-medical: Not on file  Tobacco Use  . Smoking status: Never Smoker  . Smokeless tobacco: Never Used  Substance and Sexual Activity  . Alcohol use: Not Currently  . Drug use: Never  . Sexual activity: Not on file  Lifestyle  . Physical activity:    Days per week: Not on file    Minutes per session: Not on file  . Stress: Not on file  Relationships  . Social connections:    Talks on phone: Not on file    Gets together: Not on file    Attends religious service: Not on file    Active member of club or organization: Not on file    Attends meetings of clubs or organizations: Not on file    Relationship status: Not on file  Other Topics Concern  . Not on file  Social History Narrative  . Not on file    Family History  Problem Relation Age of Onset  . Hypertension Father   . Colon cancer Neg Hx   . Esophageal cancer Neg Hx   . Rectal cancer Neg Hx   . Stomach cancer Neg Hx      ROS Review of Systems See HPI Constitution: No fevers or chills No malaise No diaphoresis Skin: No rash or itching Eyes: no blurry vision, no double vision GU: no dysuria or hematuria Neuro: no dizziness or headaches  all others reviewed and negative   Objective: Vitals:   06/24/18 0806  BP: 126/78  Pulse: 75   Resp: 16  Temp: 98 F (36.7 C)  TempSrc: Oral  SpO2: 96%  Weight: 166 lb 9.6 oz (75.6 kg)  Height: 5\' 1"  (1.549 m)    Physical Exam General: alert, oriented, in NAD Head: normocephalic, atraumatic, no sinus tenderness Eyes: EOM intact, no scleral icterus or conjunctival injection Ears: TM clear bilaterally Nose: mucosa nonerythematous, nonedematous Throat: no pharyngeal  exudate or erythema Lymph: no posterior auricular, submental or cervical lymph adenopathy Heart: normal rate, normal sinus rhythm, no murmurs Lungs: clear to auscultation bilaterally, no wheezing   Assessment and Plan Teaira was seen today for diabetes and medication refill.  Diagnoses and all orders for this visit:  Cough syncope-  Discussed with patient that her cough improved with tussionex but that we have to find out the cause of the cough  At this point she had the following that were contributing - hiatal hernia and reflux - unknown interval with LBBB so could Alleigh heart related, no other work up documented in the past - lisinopril induced cough  Discussed to stop the tussionex since she should Lavanya improving if this was an ace inhibitor cough now that she is on losartan She should continue her PPI and avoid irritants She should follow up with Cardiology and her bp meds  Left bundle branch block- follow up with Cardiology  ACE-inhibitor cough- continue metoprolol and losartan  Hiatal hernia- continue PPI  Flu vaccine need  Screening for breast cancer-  Discussed the need for mammogram for breast cancer screening -     MM Digital Screening; Future   A total of 40 minutes were spent face-to-face with the patient during this encounter and over half of that time was spent on counseling and coordination of care. - reviewing hospital course and explaining the work up - explaining and instructing on meds - explaining why we will stop the cough meds to see if the cough has improved off lisinopril -  explaining the event monitor    Kaisen Ackers A Creta Levin

## 2018-06-24 NOTE — Patient Instructions (Addendum)
We recommend that you schedule a mammogram for breast cancer screening. Typically, you do not need a referral to do this. Please contact a local imaging center to schedule your mammogram.  [facility] - [phone]  *ask for the Radiology Department [facility]) - [phone] or [phone]  [facility] - [phone] [facility] - [phone] [facility] - [phone]  *ask for the Radiology Department [facility] - [phone]  *ask for the Radiology Department [facility] - [phone]  *ask for the Mammography Department [facility] - [phone]     If you have lab work done today you will Videl contacted with your lab results within the next 2 weeks.  If you have not heard from [place] then please contact us. The fastest way to get your results is to register for My Chart.   IF you received an x-ray today, you will receive an invoice from [facility]. Please contact [facility] at [phone] with questions or concerns regarding your invoice.   IF you received labwork today, you will receive an invoice from [facility]. Please contact [facility] at [phone] with questions or concerns regarding your invoice.   Our billing staff will not Gizell able to assist you with questions regarding bills from these companies.  You will Dajsha contacted with the lab results as soon as they are available. The fastest way to get your results is to activate your My Chart account. Instructions are located on the last page of this paperwork. If you have not heard from [place] regarding the results in 2 weeks, please contact this office.    We recommend that you schedule a mammogram for breast cancer screening. Typically, you do not need a referral to do this. Please contact a local imaging center to schedule your mammogram.  [facility] - [phone]  *ask for the  Radiology Department [facility]) - [phone] or [phone]  [facility] - [phone] [facility] - [phone] [facility] - [phone]  *ask for the Radiology Department [facility] - [phone]  *ask for the Radiology Department [facility] - [phone]  *ask for the Mammography Department [facility] - [phone] Cardiac Event Monitoring A cardiac event monitor is a small recording device that is used to detect abnormal heart rhythms (arrhythmias). The monitor is used to record your heart rhythm when you have symptoms, such as:  Fast heartbeats (palpitations), such as heart racing or fluttering.  Dizziness.  Fainting or light-headedness.  Unexplained weakness.  Some monitors are wired to electrodes placed on your chest. Electrodes are flat, sticky disks that attach to your skin. Other monitors may Saysha hand-held or worn on the wrist. The monitor can Sabella worn for up to 30 days. If the monitor is attached to your chest, a technician will prepare your chest for the electrode placement and show you how to work the monitor. Take time to practice using the monitor before you leave the office. Make sure you understand how to send the information from the monitor to your health care provider. In some cases, you may need to use a landline telephone instead of a cell phone. What are the risks? Generally, this device is safe to use, but it possible that the skin under the electrodes will become irritated. How to use your cardiac event monitor  Wear your monitor  at all times, except when you are in water: ? Do not let the monitor get wet. ? Take the monitor off when you bathe. Do not swim or use a hot tub with it on.  Keep your skin clean. Do not put body lotion or moisturizer on your chest.  Change the electrodes as told by your health care provider or any time they stop sticking  to your skin. You may need to use medical tape to keep them on.  Try to put the electrodes in slightly different places on your chest to help prevent skin irritation. They must remain in the area under your left breast and in the upper right section of your chest.  Make sure the monitor is safely clipped to your clothing or in a location close to your body that your health care provider recommends.  Press the button to record as soon as you feel heart-related symptoms, such as: ? Dizziness. ? Weakness. ? Light-headedness. ? Palpitations. ? Thumping or pounding in your chest. ? Shortness of breath. ? Unexplained weakness.  Keep a diary of your activities, such as walking, doing chores, and taking medicine. It is very important to note what you were doing when you pushed the button to record your symptoms. This will help your health care provider determine what might Zulay contributing to your symptoms.  Send the recorded information as recommended by your health care provider. It may take some time for your health care provider to process the results.  Change the batteries as told by your health care provider.  Keep electronic devices away from your monitor. This includes: ? Tablets. ? MP3 players. ? Cell phones.  While wearing your monitor you should avoid: ? Electric blankets. ? Firefighter. ? Electric toothbrushes. ? Microwave ovens. ? Magnets. ? Metal detectors. Get help right away if:  You have chest pain.  You have extreme difficulty breathing or shortness of breath.  You develop a very fast heartbeat that persists.  You develop dizziness that does not go away.  You faint or constantly feel like you are about to faint. Summary  A cardiac event monitor is a small recording device that is used to help detect abnormal heart rhythms (arrhythmias).  The monitor is used to record your heart rhythm when you have heart-related symptoms.  Make sure you understand how to  send the information from the monitor to your health care provider.  It is important to press the button on the monitor when you have any heart-related symptoms.  Keep a diary of your activities, such as walking, doing chores, and taking medicine. It is very important to note what you were doing when you pushed the button to record your symptoms. This will help your health care provider learn what might Onisha causing your symptoms. This information is not intended to replace advice given to you by your health care provider. Make sure you discuss any questions you have with your health care provider. Document Released: 05/09/2008 Document Revised: 07/15/2016 Document Reviewed: 07/15/2016 Elsevier Interactive Patient Education  2017 [employer].

## 2018-07-03 ENCOUNTER — Ambulatory Visit (INDEPENDENT_AMBULATORY_CARE_PROVIDER_SITE_OTHER): Payer: Self-pay | Admitting: Cardiology

## 2018-07-03 ENCOUNTER — Encounter: Payer: Self-pay | Admitting: Cardiology

## 2018-07-03 VITALS — BP 139/82 | HR 75 | Ht 61.0 in | Wt 165.0 lb

## 2018-07-03 DIAGNOSIS — Z7189 Other specified counseling: Secondary | ICD-10-CM

## 2018-07-03 DIAGNOSIS — R059 Cough, unspecified: Secondary | ICD-10-CM

## 2018-07-03 DIAGNOSIS — I5189 Other ill-defined heart diseases: Secondary | ICD-10-CM

## 2018-07-03 DIAGNOSIS — I447 Left bundle-branch block, unspecified: Secondary | ICD-10-CM

## 2018-07-03 DIAGNOSIS — I10 Essential (primary) hypertension: Secondary | ICD-10-CM

## 2018-07-03 DIAGNOSIS — R55 Syncope and collapse: Secondary | ICD-10-CM

## 2018-07-03 DIAGNOSIS — R05 Cough: Secondary | ICD-10-CM

## 2018-07-03 NOTE — Patient Instructions (Addendum)
Medication Instructions:  Your Physician recommend you continue on your current medication as directed.    If you need a refill on your cardiac medications before your next appointment, please call your pharmacy.   Lab work: None  Testing/Procedures: Our physician has recommended that you wear an 14  DAY ZIO-PATCH monitor. The Zio patch cardiac monitor continuously records heart rhythm data for up to 14 days, this is for patients being evaluated for multiple types heart rhythms. For the first 24 hours post application, please avoid getting the Zio monitor wet in the shower or by excessive sweating during exercise. After that, feel free to carry on with regular activities. Keep soaps and lotions away from the ZIO XT Patch.   This will Seren [facility] - [address], [address].      Follow-Up: At [facility], you and your health needs are our priority.  As part of our continuing mission to provide you with exceptional heart care, we have created designated Provider Care Teams.  These Care Teams include your primary Cardiologist (physician) and Advanced Practice Providers (APPs -  Physician Assistants and Nurse Practitioners) who all work together to provide you with the care you need, when you need it. You will need a follow up appointment in 1 years.  Please call our office 2 months in advance to schedule this appointment.  You may see Dr. Cristal Deerhristopher or one of the following Advanced Practice Providers on your designated Care Team:   Theodore DemarkRhonda Barrett, PA-C . Joni ReiningKathryn Lawrence, DNP, ANP  Any Other Special Instructions Will Madeline Bell Listed Below (If Applicable).  Do the following things EVERY DAY:  1) Weigh yourself EVERY morning after you go to the bathroom but before you eat or drink anything. Write this number down in a weight log/diary. If you gain 3 pounds overnight or 5 pounds in a week, call the office.  2) Take your medicines as prescribed. If you have concerns about  your medications, please call us before you stop taking them.   3) Eat low salt foods-Limit salt (sodium) to 2000 mg per day. This will help prevent your body from holding onto fluid. Read food labels as many processed foods have a lot of sodium, especially canned goods and prepackaged meats. If you would like some assistance choosing low sodium foods, we would Soha happy to set you up with a nutritionist.  4) Stay as active as you can everyday. Staying active will give you more energy and make your muscles stronger. Start with 5 minutes at a time and work your way up to 30 minutes a day. Break up your activities--do some in the morning and some in the afternoon. Start with 3 days per week and work your way up to 5 days as you can.  If you have chest pain, feel short of breath, dizzy, or lightheaded, STOP. If you don't feel better after a short rest, call 911. If you do feel better, call the office to let us know you have symptoms with exercise.  5) Limit all fluids for the day to less than 2 liters. Fluid includes all drinks, coffee, juice, ice chips, soup, jello, and all other liquids.

## 2018-07-03 NOTE — Progress Notes (Signed)
Cardiology Office Note:    Date:  07/03/2018   ID:  Madeline Bell, DOB 1956-04-29, MRN [id number]  PCP:  Doristine Bosworth, MD  Cardiologist:  Jodelle Red, MD PhD  Referring MD: Doristine Bosworth, MD   Chief Complaint  Patient presents with  . New Patient (Initial Visit)    History of Present Illness:    Madeline Bell is a 62 y.o. female with a hx of hypertension, type II diabetes who is seen as a new consult at the request of Doristine Bosworth, MD for the evaluation and management of syncope. She was recently discharged from the hospital on 06/06/18 after a syncopal event following a coughing spell.   Daughter is present today and translating for the patient. History taken from interview today and records in chart.  Denies any prior cardiac history or testing. Has only had two episodes of syncope, both recently and related to coughing. No clear presyncopal symptoms, awakens rapidly without any confusion. Has had no additional syncope since leaving the hospital, but since stopping the cough medication she has been miserable and coughing frequently, keeping her from good sleep. No chest pain, PND, orthopnea, LE edema. Breathing feels fine, cough is dry and nonproductive.  Reviewed results of echo and chest x-rays Initially had some mild vascular congestion on chest x-ray, improved on repeat. No recent fevers or known infections. Never had issue with fluid before.  Recent GI workup showed gastritis and hiatal hernia. Had concern for esophageal dysphagia in the hospital. Nonsmoker, no alcohol. No significant family history of cardiac disease.  Past Medical History:  Diagnosis Date  . CHF (congestive heart failure) (HCC)   . Diabetes mellitus without complication (HCC)   . Dysphagia    esophageal dysphagia  . GERD (gastroesophageal reflux disease)   . Heart murmur   . Hiatal hernia 03/26/2018   EGD 03/26/18 shows gastritis and hiatal hernia 4cm  . Hypertension   . Sickle cell anemia  (HCC)    trait    Past Surgical History:  Procedure Laterality Date  . UPPER GASTROINTESTINAL ENDOSCOPY  03/26/2018    Current Medications: Current Outpatient Medications on File Prior to Visit  Medication Sig  . Ascorbic Acid (VITAMIN C PO) Take 1 tablet by mouth daily.   . bisacodyl (DULCOLAX) 5 MG EC tablet Take 5 mg by mouth once.  . chlorpheniramine-HYDROcodone (TUSSIONEX) 10-8 MG/5ML SUER Take 5 mLs by mouth every 12 (twelve) hours as needed for cough (if guaifenesin not effective). Or if Delsym is not effective.  . Continuous Blood Gluc Receiver (FREESTYLE LIBRE 14 DAY READER) DEVI 1 Device by Does not apply route QID. Use to check blood glucose 3 times a day  . Continuous Blood Gluc Sensor (FREESTYLE LIBRE 14 DAY SENSOR) MISC 1 Device by Does not apply route every 14 (fourteen) days.  Marland Kitchen dextromethorphan (DELSYM) 30 MG/5ML liquid Take 5 mLs (30 mg total) by mouth 2 (two) times daily as needed for cough.  Marland Kitchen glucose blood test strip Use as instructed  . loratadine (CLARITIN) 10 MG tablet TAKE 1 TABLET BY MOUTH EVERY DAY (Patient taking differently: Take 10 mg by mouth daily. )  . loratadine (CLARITIN) 10 MG tablet TAKE 1 TABLET BY MOUTH EVERY DAY  . losartan (COZAAR) 50 MG tablet Take 1 tablet (50 mg total) by mouth daily.  . metFORMIN (GLUCOPHAGE) 1000 MG tablet Take 1 tablet (1,000 mg total) by mouth 2 (two) times daily with a meal.  . metoprolol tartrate (LOPRESSOR) 25 MG  tablet Take 0.5 tablets (12.5 mg total) by mouth 2 (two) times daily.  Marland Kitchen omeprazole (PRILOSEC) 20 MG capsule Take 1 capsule (20 mg total) by mouth 2 (two) times daily.  . polyethylene glycol powder (MIRALAX) powder Take 1 Container by mouth once.   No current facility-administered medications on file prior to visit.      Allergies:   Tylenol [acetaminophen]; Amlodipine besylate; Lisinopril-hydrochlorothiazide; Bactrim [sulfamethoxazole-trimethoprim]; Cephalexin; Doxycycline; Gentamicin sulfate; Shellfish  allergy; Nsaids; Penicillins; and Tetracyclines & related   Social History   Socioeconomic History  . Marital status: Married    Spouse name: Not on file  . Number of children: Not on file  . Years of education: Not on file  . Highest education level: Not on file  Occupational History  . Not on file  Social Needs  . Financial resource strain: Not on file  . Food insecurity:    Worry: Not on file    Inability: Not on file  . Transportation needs:    Medical: Not on file    Non-medical: Not on file  Tobacco Use  . Smoking status: Never Smoker  . Smokeless tobacco: Never Used  Substance and Sexual Activity  . Alcohol use: Not Currently  . Drug use: Never  . Sexual activity: Not on file  Lifestyle  . Physical activity:    Days per week: Not on file    Minutes per session: Not on file  . Stress: Not on file  Relationships  . Social connections:    Talks on phone: Not on file    Gets together: Not on file    Attends religious service: Not on file    Active member of club or organization: Not on file    Attends meetings of clubs or organizations: Not on file    Relationship status: Not on file  Other Topics Concern  . Not on file  Social History Narrative  . Not on file     Family History: The patient's family history includes Diabetes in her brother and sister; Hypertension in her father. There is no history of Colon cancer, Esophageal cancer, Rectal cancer, or Stomach cancer.  ROS:   Please see the history of present illness.  Additional pertinent ROS:  Constitutional: Negative for chills, fever, night sweats, unintentional weight loss  HENT: Negative for ear pain and hearing loss.   Eyes: Negative for loss of vision and eye pain.  Respiratory: Positive for nonproductive cough. Negative for sputum, shortness of breath, wheezing.   Cardiovascular: Positive for syncope. Negative for chest pain, palpitations, PND, orthopnea, lower extremity edema and claudication.    Gastrointestinal: Negative for abdominal pain, melena, and hematochezia.  Genitourinary: Negative for dysuria and hematuria.  Musculoskeletal: Negative for falls and myalgias.  Skin: Negative for itching and rash.  Neurological: Negative for focal weakness, focal sensory changes and loss of consciousness.  Endo/Heme/Allergies: Does not bruise/bleed easily.    EKGs/Labs/Other Studies Reviewed:    The following studies were reviewed today: Echo 06/04/18 Study Conclusions  - Left ventricle: The cavity size was normal. Wall thickness was   normal. Systolic function was normal. The estimated ejection   fraction was in the range of 60% to 65%. Wall motion was normal;   there were no regional wall motion abnormalities. Doppler   parameters are consistent with abnormal left ventricular   relaxation (grade 1 diastolic dysfunction). Doppler parameters   are consistent with elevated mean left atrial filling pressure. - Left atrium: The atrium was mildly dilated. -  Pericardium, extracardiac: A trivial pericardial effusion was   identified.  EKG:  EKG is personally reviewed.  The ekg ordered today demonstrates normal sinus rhythm with LBBB  Recent Labs: 02/23/2018: NT-Pro BNP 12 06/04/2018: TSH 2.189 06/06/2018: ALT 34; BUN 11; Creatinine, Ser 0.71; Hemoglobin 10.1; Magnesium 2.0; Platelets 354; Potassium 4.1; Sodium 141  Recent Lipid Panel    Component Value Date/Time   CHOL 266 (H) 02/20/2018 1012   TRIG 325 (H) 02/20/2018 1012   HDL 35 (L) 02/20/2018 1012   CHOLHDL 7.6 (H) 02/20/2018 1012   LDLCALC 166 (H) 02/20/2018 1012    Physical Exam:    VS:  BP 139/82   Pulse 75   Ht 5\' 1"  (1.549 m)   Wt 165 lb (74.8 kg)   BMI 31.18 kg/m    Orthostatic VS for the past 24 hrs (Last 3 readings):  BP- Lying Pulse- Lying BP- Sitting Pulse- Sitting BP- Standing at 0 minutes Pulse- Standing at 0 minutes  07/03/18 0935 147/90 77 133/85 72 130/81 78    Wt Readings from Last 3 Encounters:   07/03/18 165 lb (74.8 kg)  06/24/18 166 lb 9.6 oz (75.6 kg)  06/04/18 165 lb 2 oz (74.9 kg)     GEN: Well nourished, well developed in no acute distress HEENT: Normal NECK: No JVD; No carotid bruits LYMPHATICS: No lymphadenopathy CARDIAC: regular rhythm, normal S1 and S2, no murmurs, rubs, gallops. Radial and DP pulses 2+ bilaterally. RESPIRATORY:  Clear to auscultation without rales, wheezing or rhonchi  ABDOMEN: Soft, non-tender, non-distended MUSCULOSKELETAL:  No edema; No deformity  SKIN: Warm and dry NEUROLOGIC:  Alert and oriented x 3 PSYCHIATRIC:  Normal affect   ASSESSMENT:    1. Syncope, unspecified syncope type   2. HTN (hypertension), benign   3. LBBB (left bundle branch block)   4. Cough   5. Diastolic dysfunction   6. Encounter for education about heart failure   7. Counseling on health promotion and disease prevention    PLAN:    1. Syncope: highly suspect that this is tussive induced. However, given LBBB it is reasonable to make sure that she is not having conduction abnormality as the cause. There can Cadi conduction changes related to reflexes 2/2 coughing, so will need to evaluate if bradycardia/pause/block is primary cause or 2/2 cough if seen on monitor. Discussed 30 day event vs 14 day Zio, would prefer the less cumbersome monitor -ordered 14 day Zio -echo unremarkable for structural heart disease as cause  2. Cough, Diastolic dysfunction: her EF and systolic function are normal, and she does not have any significant valvular disease. She has only grade 1 diastolic dysfunction. On review of her notes, as far back as July of this year she was noted to have a cough and some edema; however, BNP was unremarkable at the time. She did have mild vascular congestion on chest xray on admission, which resolved during hospitalization. We discussed heart failure education (below), but she is euvolemic today and continues to have cough and symptoms. I do not think her heart  is the source of her cough, and I am fine with continuing the antitussive regimen given how much it affects her sleep and activity. Agree with pulmonary and GI involvement for evaluation of cough.  3. LBBB: with normal EF, no further testing required at this time.  4. Primary prevention: -recommend heart healthy/Mediterranean diet, with whole grains, fruits, vegetable, fish, lean meats, nuts, and olive oil. Limit salt. -recommend moderate walking, 3-5 times/week  for 30-50 minutes each session. Aim for at least 150 minutes.week. Goal should Lorice pace of 3 miles/hours, or walking 1.5 miles in 30 minutes -recommend avoidance of tobacco products. Avoid excess alcohol. -Additional risk factor control:  -Diabetes: A1c is 7.6.   -Lipids: given diabetes, would recommend statin but on review of PCP notes, there is concern for medication intolerance with her history. Once on a stable med regimen from a general perspective, would trial a statin like pravastatin that tends to Trameka better tolerated  -Blood pressure control: With her hypertension, discussed that the best management of long term diastolic dysfunction is blood pressure control. Her standing BP was at goal today, would continue to monitor on her current regimen.  -Weight: BMI 31.2 -ASCVD risk score: The 10-year ASCVD risk score Denman George DC Montez Hageman., et al., 2013) is: 18.4%   Values used to calculate the score:     Age: 15 years     Sex: Female     Is Non-Hispanic African American: No     Diabetic: Yes     Tobacco smoker: No     Systolic Blood Pressure: 139 mmHg     Is BP treated: Yes     HDL Cholesterol: 35 mg/dL     Total Cholesterol: 266 mg/dL   Plan for follow up: 1 year or sooner PRN based on results of monitor  Medication Adjustments/Labs and Tests Ordered: Current medicines are reviewed at length with the patient today.  Concerns regarding medicines are outlined above.  Orders Placed This Encounter  Procedures  . LONG TERM MONITOR (3-14  DAYS)  . EKG 12-Lead   No orders of the defined types were placed in this encounter.   Patient Instructions  Medication Instructions:  Your Physician recommend you continue on your current medication as directed.    If you need a refill on your cardiac medications before your next appointment, please call your pharmacy.   Lab work: None  Testing/Procedures: Our physician has recommended that you wear an 14  DAY ZIO-PATCH monitor. The Zio patch cardiac monitor continuously records heart rhythm data for up to 14 days, this is for patients being evaluated for multiple types heart rhythms. For the first 24 hours post application, please avoid getting the Zio monitor wet in the shower or by excessive sweating during exercise. After that, feel free to carry on with regular activities. Keep soaps and lotions away from the ZIO XT Patch.   This will Kayleanna placed at our [facility] location - [address].      Follow-Up: At [facility], you and your health needs are our priority.  As part of our continuing mission to provide you with exceptional heart care, we have created designated Provider Care Teams.  These Care Teams include your primary Cardiologist (physician) and Advanced Practice Providers (APPs -  Physician Assistants and Nurse Practitioners) who all work together to provide you with the care you need, when you need it. You will need a follow up appointment in 1 years.  Please call our office 2 months in advance to schedule this appointment.  You may see Dr. Cristal Deer or one of the following Advanced Practice Providers on your designated Care Team:   Theodore Demark, PA-C . Joni Reining, DNP, ANP  Any Other Special Instructions Will Kyeisha Listed Below (If Applicable).  Do the following things EVERY DAY:  1) Weigh yourself EVERY morning after you go to the bathroom but before you eat or drink anything.  Write this number down in a weight log/diary. If you gain 3 pounds  overnight or 5 pounds in a week, call the office.  2) Take your medicines as prescribed. If you have concerns about your medications, please call us before you stop taking them.   3) Eat low salt foods-Limit salt (sodium) to 2000 mg per day. This will help prevent your body from holding onto fluid. Read food labels as many processed foods have a lot of sodium, especially canned goods and prepackaged meats. If you would like some assistance choosing low sodium foods, we would Louann happy to set you up with a nutritionist.  4) Stay as active as you can everyday. Staying active will give you more energy and make your muscles stronger. Start with 5 minutes at a time and work your way up to 30 minutes a day. Break up your activities--do some in the morning and some in the afternoon. Start with 3 days per week and work your way up to 5 days as you can.  If you have chest pain, feel short of breath, dizzy, or lightheaded, STOP. If you don't feel better after a short rest, call 911. If you do feel better, call the office to let us know you have symptoms with exercise.  5) Limit all fluids for the day to less than 2 liters. Fluid includes all drinks, coffee, juice, ice chips, soup, jello, and all other liquids.     Signed, Jodelle RedBridgette Kahlia Lagunes, MD PhD 07/03/2018 10:22 AM    [facility]

## 2018-07-04 ENCOUNTER — Telehealth: Payer: Self-pay | Admitting: Family Medicine

## 2018-07-04 ENCOUNTER — Ambulatory Visit: Payer: Self-pay

## 2018-07-04 NOTE — Telephone Encounter (Signed)
Pt.'s daughter called requesting a refill of pt.'s  Tussionex cough syrup. Reports this is the only thing helping her mother's cough and she is completely out. Still having pain with cough.Is scheduled to see Pulmonology in December. [facility]

## 2018-07-04 NOTE — Telephone Encounter (Signed)
Copied from CRM [phone]#[id number]. Topic: Quick Communication - Rx Refill/Question >> Jul 04, 2018  3:44 PM Jens SomMedley, Jennifer A wrote: Medication: chlorpheniramine-HYDROcodone (TUSSIONEX) 10-8 MG/5ML Kenton KingfisherSUER [[id number]][[id number]]   Has the patient contacted their pharmacy? Yes  (Agent: If no, request that the patient contact the pharmacy for the refill.) (Agent: If yes, when and what did the pharmacy advise?)  Preferred Pharmacy (with phone number or street name): [id number] [place], [place] - [phone] [facility] [address] [place] [id number] Phone: [phone] Fax: [phone]    Agent: Please Issabelle advised that RX refills may take up to 3 business days. We ask that you follow-up with your pharmacy.

## 2018-07-07 NOTE — Telephone Encounter (Signed)
While she is on the ZIO cardiac monitor patch she should contact the Cardiologist to find out about the cough medication.  At this point it looks like they are trying to monitor what is happening while she is coughing so I would contact their office on guidance. Once the cardiac monitor patch is off I can certainly send some meds in.

## 2018-07-08 ENCOUNTER — Other Ambulatory Visit: Payer: Self-pay | Admitting: Family Medicine

## 2018-07-08 ENCOUNTER — Institutional Professional Consult (permissible substitution): Payer: Self-pay | Admitting: Internal Medicine

## 2018-07-08 DIAGNOSIS — R059 Cough, unspecified: Secondary | ICD-10-CM

## 2018-07-08 DIAGNOSIS — R05 Cough: Secondary | ICD-10-CM

## 2018-07-09 ENCOUNTER — Telehealth: Payer: Self-pay | Admitting: Cardiology

## 2018-07-09 NOTE — Telephone Encounter (Signed)
Spoke with pt's daughter and advised that a hard copy will Lamonda faxed to the fax number provided by Dr. Creta LevinStallings office [phone]. Verbalized understanding.

## 2018-07-09 NOTE — Telephone Encounter (Signed)
Pt's daughter, Hedy JacobDjin calling - states that she wants to know if the note from Cardiology has been received yet.  They are trying to get cough syrup for pt.

## 2018-07-09 NOTE — Telephone Encounter (Signed)
Requested medication (s) are due for refill today: no  Requested medication (s) are on the active medication list: no  Last refill:  06/07/18  Future visit scheduled: yes  Notes to clinic:  D/C'd 02/2018    Requested Prescriptions  Pending Prescriptions Disp Refills   ranitidine (ZANTAC) 150 MG tablet [Pharmacy Med Name: RANITIDINE 150 MG TABLET] 60 tablet 1    Sig: TAKE 1 TABLET BY MOUTH TWICE A DAY     Gastroenterology:  H2 Antagonists Passed - 07/08/2018  1:52 AM      Passed - Valid encounter within last 12 months    Recent Outpatient Visits          2 weeks ago Cough syncope   [facility], Zoe A, MD   3 months ago Type 2 diabetes mellitus with hyperglycemia, without long-term current use of insulin (HCC)   [facility], Manus RuddZoe A, MD   4 months ago Essential hypertension   [facility], Asencion PartridgeJeffrey R, MD   4 months ago Essential hypertension   [facility], Manus RuddZoe A, MD   4 months ago Cough   [facility], Asencion PartridgeJeffrey R, MD      Future Appointments            In 1 week Doristine BosworthStallings, Zoe A, MD [facility]

## 2018-07-09 NOTE — Telephone Encounter (Signed)
Spoke to daughter (ok per DPR)-daughter states she had appt with Dr. Cristal Deerhristopher the other day and she was going to reach out to her PCP to let her know she was okay with her taking her cough medication (Tussionex).  She states PCP has not received anything and they will not prescribe her the medication.   She is frustrated because her mother is coughing a lot and in a lot of pain d/t coughing.       Advised would send message to Dr. Cristal Deerhristopher and primary nurse to follow up.     Per OV:  Cough, Diastolic dysfunction: her EF and systolic function are normal, and she does not have any significant valvular disease. She has only grade 1 diastolic dysfunction. On review of her notes, as far back as July of this year she was noted to have a cough and some edema; however, BNP was unremarkable at the time. She did have mild vascular congestion on chest xray on admission, which resolved during hospitalization. We discussed heart failure education (below), but she is euvolemic today and continues to have cough and symptoms. I do not think her heart is the source of her cough, and I am fine with continuing the antitussive regimen given how much it affects her sleep and activity. Agree with pulmonary and GI involvement for evaluation of cough.

## 2018-07-09 NOTE — Telephone Encounter (Signed)
Call from daughter, requesting cough medicine.  States cardiology was going to send note that it is ok to give cough medicine.  Advised that provider has reviewed the chart and cough medicine is pending cardiology monitor.  Reviewed card notes but does not ok cough medicine.  Recommended that pt reach out to cardiology to see if they could provide note or send clearance to our provider.

## 2018-07-09 NOTE — Telephone Encounter (Signed)
New message    Pt daughter is calling with questions regarding medications and her mothers appt on Thursday.

## 2018-07-09 NOTE — Telephone Encounter (Signed)
Thank you for letting us know. We did route a note to the primary care doctor. We will also try to fax a hard copy of the note to let them know that it is fine to continue the cough medication.

## 2018-07-10 ENCOUNTER — Other Ambulatory Visit: Payer: Self-pay | Admitting: Cardiology

## 2018-07-10 ENCOUNTER — Ambulatory Visit (INDEPENDENT_AMBULATORY_CARE_PROVIDER_SITE_OTHER): Payer: Self-pay

## 2018-07-10 DIAGNOSIS — R55 Syncope and collapse: Secondary | ICD-10-CM

## 2018-07-10 DIAGNOSIS — I447 Left bundle-branch block, unspecified: Secondary | ICD-10-CM

## 2018-07-13 NOTE — Telephone Encounter (Signed)
Unable to contact pt to advise no note from cardio received that I know of- [place] service is not accessible due to thanksgiving holiday.  Will need to try again to contact pt on Monday when service should [place] available again. Dgaddy, CMA

## 2018-07-15 ENCOUNTER — Ambulatory Visit (INDEPENDENT_AMBULATORY_CARE_PROVIDER_SITE_OTHER): Payer: Self-pay | Admitting: Pulmonary Disease

## 2018-07-15 ENCOUNTER — Encounter: Payer: Self-pay | Admitting: Pulmonary Disease

## 2018-07-15 ENCOUNTER — Other Ambulatory Visit: Payer: Self-pay

## 2018-07-15 ENCOUNTER — Other Ambulatory Visit (HOSPITAL_COMMUNITY): Payer: Self-pay | Admitting: Pulmonary Disease

## 2018-07-15 VITALS — BP 110/72 | HR 68 | Ht 62.0 in | Wt 156.0 lb

## 2018-07-15 DIAGNOSIS — R1310 Dysphagia, unspecified: Secondary | ICD-10-CM

## 2018-07-15 DIAGNOSIS — R05 Cough: Secondary | ICD-10-CM

## 2018-07-15 DIAGNOSIS — R059 Cough, unspecified: Secondary | ICD-10-CM

## 2018-07-15 MED ORDER — OMEPRAZOLE 20 MG PO CPDR: 20.0000 mg | DELAYED_RELEASE_CAPSULE | Freq: Two times a day (BID) | ORAL | 5 refills | Status: DC

## 2018-07-15 MED ORDER — HYDROCOD POLST-CPM POLST ER 10-8 MG/5ML PO SUER: 5.0000 mL | Freq: Two times a day (BID) | ORAL | 0 refills | Status: DC | PRN

## 2018-07-15 NOTE — Telephone Encounter (Signed)
Dr. Alvy BimlerSagardia sent in Tussionex in Dr. Creta LevinStallings' absence

## 2018-07-15 NOTE — Patient Instructions (Addendum)
Thank you for visiting Dr. Tonia Brooms at [facility]. Today we recommend the following:  Orders Placed This Encounter  Procedures  . QuantiFERON-TB Gold Plus  . SLP modified barium swallow   Meds ordered this encounter  Medications  . omeprazole (PRILOSEC) 20 MG capsule    Sig: Take 1 capsule (20 mg total) by mouth 2 (two) times daily.    Dispense:  60 capsule    Refill:  5   Return in about 4 weeks (around 08/12/2018).  Follow up with one of nurse practioners in a few weeks to ensure improvement in symptoms.   __________________________________________________________________________________________  Food Choices for Gastroesophageal Reflux Disease, Adult When you have gastroesophageal reflux disease (GERD), the foods you eat and your eating habits are very important. Choosing the right foods can help ease your discomfort. What guidelines do I need to follow?  Choose fruits, vegetables, whole grains, and low-fat dairy products.  Choose low-fat meat, fish, and poultry.  Limit fats such as oils, salad dressings, butter, nuts, and avocado.  Keep a food diary. This helps you identify foods that cause symptoms.  Avoid foods that cause symptoms. These may Bernardine different for everyone.  Eat small meals often instead of 3 large meals a day.  Eat your meals slowly, in a place where you are relaxed.  Limit fried foods.  Cook foods using methods other than frying.  Avoid drinking alcohol.  Avoid drinking large amounts of liquids with your meals.  Avoid bending over or lying down until 2-3 hours after eating. What foods are not recommended? These are some foods and drinks that may make your symptoms worse: Vegetables Tomatoes. Tomato juice. Tomato and spaghetti sauce. Chili peppers. Onion and garlic. Horseradish. Fruits Oranges, grapefruit, and lemon (fruit and juice). Meats High-fat meats, fish, and poultry. This includes hot dogs, ribs, ham, sausage, salami, and  bacon. Dairy Whole milk and chocolate milk. Sour cream. Cream. Butter. Ice cream. Cream cheese. Drinks Coffee and tea. Bubbly (carbonated) drinks or energy drinks. Condiments Hot sauce. Barbecue sauce. Sweets/Desserts Chocolate and cocoa. Donuts. Peppermint and spearmint. Fats and Oils High-fat foods. This includes [place] fries and potato chips. Other Vinegar. Strong spices. This includes black pepper, white pepper, red pepper, cayenne, curry powder, cloves, ginger, and chili powder. The items listed above may not Inette a complete list of foods and drinks to avoid. Contact your dietitian for more information. This information is not intended to replace advice given to you by your health care provider. Make sure you discuss any questions you have with your health care provider. Document Released: 01/30/2012 Document Revised: 01/06/2016 Document Reviewed: 06/04/2013 Elsevier Interactive Patient Education  2017 Elsevier Inc.   L?a Ch?n Th?c ?n cho B?nh Tro Ng??c D? Dy Th?c Qu?n, Ng??i L?n Food Choices for Gastroesophageal Reflux Disease, Adult Khi qu v? b? b?nh tro ng??c d? dy th?c qu?n (GERD), th?c ph?m qu v? ?n v thi quen ?n u?ng c?a qu v? l r?t quan tr?ng. L?a ch?n th?c ph?m ?ng c th? gip lm gi?m s? kh ch?u c?a b?nh tro ng??c d? dy th?c qu?n (GERD). Cn nh?c lm vi?c v?i m?t chuyn gia v? ch? ?? ?n v dinh d??ng (chuyn gia dinh d??ng) nh?m gip qu v? l?a ch?n nh?ng th?c ph?m t?t cho s?c kh?e. Nh?ng h??ng d?n chung no ti ph?i tun theo? K? ho?ch ?n u?ng  Ch?n nh?ng th?c ph?m t?t cho s?c kh?e t ch?t bo, ch?ng h?n nh? tri cy, rau c?, ng? c?c nguyn h?t, cc s?n  ph?m t? s?a t bo, v th?t n?c, c, v th?t gia c?m.  ?n cc b?a nh?, th??ng xuyn thay v ba b?a l?n m?i ngy. ?n ch?m ri, trong khng gian th? gin. Trnh g?p ng??i ho?c n?m xu?ng cho ??n khi ?n xong ???c 2-3 gi?.  H?n ch? cc th?c ph?m giu ch?t bo ch?ng h?n nh? th?t nhi?u m? ho?c th?c ph?m chin.  Gi?i  h?n l??ng tiu th? d?u, b?, v ch?t bo t? d?u th?c v?t ? m?c d??i 8 mu?ng c ph m?i ngy.  Tra?nh nh??ng th?? sau: ? Cc th?c ph?m gy ra cc tri?u ch?ng. Nh?ng tri?u ch?ng ny c th? khc nhau ??i v?i nh?ng ng??i khc nhau. Hy ghi nh?t k th?c ph?m ?? theo di nh?ng th?c ph?m no gy ra cc tri?u ch?ng. ? R??u. ? U?ng nhi?u ch?t l?ng khi ?n. ? ?n trong kho?ng 2-3 gi? tr??c khi ?i ng?.  N?u ?n b?ng cc ph??ng php khc thay vi? chin. Ph??ng php ny c th? bao g?m b? l, n??ng, ho?c hun nng. L?i s?ng   Duy tr m??c cn n?ng c l?i cho s?c kh?e. H?i chuyn gia ch?m [place] s?c kh?e c?a quy? vi? xem m??c cn n??ng na?o la? t?t cho quy? vi?. N?u qu v? c?n gi?m cn, hy trao ??i v?i chuyn gia ch?m [place] s?c kh?e c?a qu v? ?? gi?m cn m?t cch an ton.  T?p th? d?c t?i thi?u 30 pht t? 5 ngy tr? ln m?i tu?n, ho?c theo ch? d?n c?a chuyn gia ch?m [place] s?c kh?e.  Trnh m?c qu?n o b ch?t quanh th?t l?ng v ng?c.  Khng s? d?ng b?t k? s?n ph?m no ch?a nicotine ho?c thu?c l, ch?ng ha?n nh? thu?c l d?ng ht v thu?c l ?i?n t?. N?u qu v? c?n gip ?? ?? cai thu?c, hy h?i chuyn gia ch?m [place] s?c kh?e.  Ng? v?i ??u gi??ng k cao. S? d?ng chm d??i n?m ho?c t?m k d??i khung gi??ng ?? nng ??u gi??ng ln. Nh?ng lo?i th?c ?n no khng ???c khuyn dng? Nh?ng m?c ???c li?t k c th? khng ph?i danh sch ??y ??. Hy trao ??i v?i bc s? chuyn khoa dinh d??ng xem cc l?a ch?n ch? ?? dinh d??ng no ph h?p nh?t v?i qu v?. Ng? c?c Bnh ng?t ho?c bnh m n??ng nhanh b? sung ch?t bo. Bnh m n??ng c?a Php. Rau c? Derrek Gu c? chin ng?p d?u. Khoai ty chin. M?i lo?i rau c? ???c ch? bi?n km thm ch?t bo. M?i lo?i rau c? gy ra cc tri?u ch?ng. ??i v?i m?t s? ng??i, nh?ng lo?i rau c? ny c th? bao g?m c chua v cc s?n ph?m t? c chua, ?t, hnh v t?i, v c?i ng?a. Tri cy M?i lo?i tri cy ???c ch? bi?n km thm ch?t bo. M?i lo?i tri cy c th? gy ra cc tri?u ch?ng. ??i v?i m?t s? ng??i, nh?ng  lo?i tri cy ny c th? bao g?m tri cy h? cam qut, ch?ng h?n nh? cam, b??i, qu? d?a, v chanh. Th?t v cc th?c ph?m ch?a protein khc Th?t giu ch?t bo, ch?ng h?n nh? th?t l?n ho?c th?t b nhi?u m?, xc xch, s??n, gi?m bng, l?p x??ng, [place] v th?t l?n mu?i xng khi. Th?t chin ho?c protein, bao g?m c chin v th?t g chin. Qu? h?ch v b? h?t. S?a. S?a nguyn kem v s?a s-c-la. Kem chuaEvans Lance. Kem. Pho mt kem. S?a l?c. ?? u?ng C ph v tr, c  ho?c khng c caffeine. ?? u?ng c ga. Soda. ?? u?ng t?ng l?c. N??c p tri cy lm t? tri cy chua (ch?ng h?n nh? cam ho?c b??i). N??c p c chua. ?? u?ng ch?a c?n. M? v d?u B?. B? th?c v?t. Ch?t bo t? d?u th?c v?t. B? s??a tru. K?o v ?? trng mi?ng S-c-la v c ca. Bnh rnMilagros Loll. Gia v? v cc th?c ph?m khc. H?t tiu. B?c h v b?c h l?c. M?i lo?i gia v?, th?o d??c, ho?c gia v? gy ra cc tri?u ch?ng. ??i v?i m?t s? ng??i, gia v? v cc th?c ph?m khc c th? bao g?m c ri, n??c s?t nng, ho?c d?u d?m ?? tr?n salad. Tm t?t  Khi qu v? b? b?nh tro ng??c d? dy th?c qu?n (GERD), cc l?a ch?n th?c ph?m v l?i s?ng c vai tr r?t quan tr?ng ?? gip lm gi?m s? kh ch?u c?a GERD.  ?n cc b?a nh?, th??ng xuyn thay v ba b?a l?n m?i ngy. ?n ch?m ri, trong khng gian th? gin. Trnh g?p ng??i ho?c n?m xu?ng cho ??n khi ?n xong ???c 2-3 gi?.  H?n ch? cc th?c ph?m giu ch?t bo, ch?ng h?n nh? th?t m? ho?c th?c ph?m chin. Thng tin ny khng nh?m m?c ?ch thay th? cho l?i khuyn m chuyn gia ch?m [place] s?c kh?e ni v?i qu v?. Hy b?o ??m qu v? ph?i th?o lu?n b?t k? v?n ?? g m qu v? c v?i chuyn gia ch?m [place] s?c kh?e c?a qu v?. Document Released: 11/17/2016 Document Revised: 11/17/2016 Elsevier Interactive Patient Education  2018 Elsevier Inc.  Cough, Adult A cough helps to clear your throat and lungs. A cough may last only 2-3 weeks (acute), or it may last longer than 8 weeks (chronic). Many different things can cause a cough. A  cough may Alexcis a sign of an illness or another medical condition. Follow these instructions at home:  Pay attention to any changes in your cough.  Take medicines only as told by your doctor. ? If you were prescribed an antibiotic medicine, take it as told by your doctor. Do not stop taking it even if you start to feel better. ? Talk with your doctor before you try using a cough medicine.  Drink enough fluid to keep your pee (urine) clear or pale yellow.  If the air is dry, use a cold steam vaporizer or humidifier in your home.  Stay away from things that make you cough at work or at home.  If your cough is worse at night, try using extra pillows to raise your head up higher while you sleep.  Do not smoke, and try not to [place] around smoke. If you need help quitting, ask your doctor.  Do not have caffeine.  Do not drink alcohol.  Rest as needed. Contact a doctor if:  You have new problems (symptoms).  You cough up yellow fluid (pus).  Your cough does not get better after 2-3 weeks, or your cough gets worse.  Medicine does not help your cough and you are not sleeping well.  You have pain that gets worse or pain that is not helped with medicine.  You have a fever.  You are losing weight and you do not know why.  You have night sweats. Get help right away if:  You cough up blood.  You have trouble breathing.  Your heartbeat is very fast. This information is not intended to replace advice given to you by your health  care provider. Make sure you discuss any questions you have with your health care provider. Document Released: 04/13/2011 Document Revised: 01/06/2016 Document Reviewed: 10/07/2014 Elsevier Interactive Patient Education  2018 [employer].    Ho, Ng???i l??n Cough, Adult Ho l ph?n x? la?m sa?ch h?ng va? ???ng th? cu?a quy? vi?. Ho gip ch?a lnh v b?o v? ph?i c?a quy? vi?. Thi?nh thoa?ng ho l bnh th??ng, nh?ng ho x?y ra ke?m theo cc tri?u ch?ng khc  ho?c ko di c th? l m?t d?u hi?u c?a m?t b?nh ly? c?n ?i?u tr?Marland Kitchen Ho c th? ch? ko di 2-3 tu?n (c?p tnh), ho?c c th? ko di trn 8 tu?n (m?n tnh). Nguyn nhn g gy ra? Ho th??ng la? do:  Hi?t ph?i cc ch?t gy kch ?ng ph?i c?a quy? vi?.  Nhi?m vi rt ho?c vi khu?n ???ng h h?p.  D? ?ng.  Hen suy?n.  S? mu?i pha sau.  Ht thu?c.  Axi?t tra?o ng???c t?? d? dy ln th?c qu?n (b?nh tro ng???c da? da?y th??c qua?n).  M?t s? lo?i thu?c nh?t ??nh.  Nh??ng v?n ?? ph?i ma?n tnh, k? c? COPD (ho?c hi?m khi, ung th? ph?i).  Cc ti?nh tra?ng b?nh ly? kha?c ch?ng h?n nh? suy tim.  Tun th? nh?ng h??ng d?n ny ? nh: Ch  ??n b?t c? thay ??i no v? tri?u ch?ng c?a qu v?. Th?c hi?n nh?ng hnh ??ng ny ?? gip gi?m c?m gic kho? chi?u:  Ch? s? d?ng thu?c theo ch? d?n c?a chuyn gia ch?m [place] s?c kh?e cu?a quy? vi?. ? N?u qu v? ???c k thu?c khng sinh, hy dng thu?c theo ch? d?n c?a chuyn gia ch?m [place] s?c kh?e. Khng d?ng u?ng thu?c khng sinh ngay c? khi qu v? b?t ??u c?m th?y ?? h?n. ? Ni chuy?n v?i chuyn gia ch?m [place] s?c kh?e tr??c khi quy? vi? du?ng m?t lo?i thu?c gia?m ho.  U?ng ?? n??c ?? gi? cho n??c ti?u trong ho?c c mu vng nh?t.  N?u khng kh kh, ha?y s? d?ng m?t ma?y h?i n???c l?nh ho??c ma?y t?o ?m trong phng ng? ho?c trong nha? quy? vi? ?? gip la?m l?ng di?ch ti?t ra.  Trnh b?t c? ?i?u g la?m quy? vi? ho t?i n?i lm vi?c hay ? nh.  N?u quy? vi? ho n??ng h?n vo ban ?m, hy th? ng? ?? v? tr n??a ng?i n??a n??m.  [place] khi thu?c l. N?u qu v? ht thu?c, ha?y cai thu?c. N?u qu v? c?n gip ?? ?? cai thu?c, hy h?i chuyn gia ch?m [place] s?c kh?e.  Trnh caffeine.  Trnh u?ng r??u.  Ngh? ng?i khi c?n.  Hy lin l?c v?i chuyn gia ch?m [place] s?c kh?e n?u:  Qu v? c cc tri?u ch?ng m?i.  Quy? vi? ho ra m?Anselmo Rod? vi? khng ??? ho sau 2-3 tu?n, ho?c quy? vi? ho n?ng h?n.  Qu v? khng th? ki?m sot c?n ho c?a mnh b?ng thu?c ho v quy?  vi? b? m?t ng?.  Quy? vi? bi? ?au ma? tr?? nn n??ng h?n ho?c ?au m khng ki?m sot ????c b??ng thu?c gia?m ?au.  Qu v? b? s?t.  Qu v? b? s?t cn khng r nguyn nhn.  Qu v? ?? m? hi ban ?m. Yu c?u tr? gip ngay l?p t?c n?u:  Qu v? ho ra mu.  Qu v? b? kh th?.  Nh?p tim c?a quy? vi? r?t nhanh. Thng tin ny khng nh?m m?c ?ch thay th? cho l?i khuyn m  chuyn gia ch?m [place] s?c kh?e ni v?i qu v?. Hy b?o ??m qu v? ph?i th?o lu?n b?t k? v?n ?? g m qu v? c v?i chuyn gia ch?m [place] s?c kh?e c?a qu v?. Document Released: 11/22/2015 Document Revised: 11/13/2016 Document Reviewed: 10/07/2014 Elsevier Interactive Patient Education  2018 [employer].

## 2018-07-15 NOTE — Telephone Encounter (Signed)
Madeline Bell the daughter of the pt called and was upset that there was not a script called in yet for her moms cough syrup. [facility] and she states that they did receive the faxed ok from her cardioligist and that she would get the script called in today to her [facility]. I told Madeline Bell to give it about an hour and check with the pharmacy, she stated ok.

## 2018-07-15 NOTE — Progress Notes (Signed)
Synopsis: Referred in Dec 2019 for cough by Doristine Bosworth, MD  Subjective:   PATIENT ID: Madeline Bell GENDER: female DOB: [birth year], MRN: [id number]  Chief Complaint  Patient presents with  . Consult    States she developed a cough in June. Usually  dry cough but she coughed up blood last week. She reports having chest pain and SOB. States the chest pain only occurs when she cough. Recently she had a syncope episode following a coughing spell.     She has seen primary care, cardiology and seen in urgent care due to ongoing cough. The cough has been present for 6 months. She was working in the garden in June. During her time in June she had really high blood pressure. She was newly diagnosed with DMII and HTN. Both are new diagnosis. The cough started after her first episode. Her cough is usually worse at night time. She has had cough related syncope. Her cough is usually productive. Coughed up blood a few days ago. Only a few small flecks or streaks. Denies fevers, chills, night sweats, weight loss.   She moved here from [place] 13 years ago. She has sickle cell trait. Mother deceased but unclear cause. Family history of DMII. Life long non-smoker. She likes to cook, clean and garden.   She had an EGD back in August.  She was found to have some erosions and evidence of gastritis and duodenal apathy.  She was placed on twice daily PPI.  During this time when she was taking the medication twice a day her cough was improved.  She does admit to cough associated with swallowing.  It is usually when she is eating vegetables are things that she has to really chew up fine before swallowing.  Sometimes it feels like it gets stuck in the back of her throat and she will cough hard enough to bring the food back out.  She currently sleeps flat or on her side.  And does not sleep with head of bed elevated.  When asked specifically about episodes of cough it appears that it is predominantly episodic.  And  occurs randomly throughout the day.  It is worse at night and has been associated with difficulty swallowing as well as the sensation of food being hung in the back of the throat.   Past Medical History:  Diagnosis Date  . CHF (congestive heart failure) (HCC)   . Diabetes mellitus without complication (HCC)   . Dysphagia    esophageal dysphagia  . GERD (gastroesophageal reflux disease)   . Heart murmur   . Hiatal hernia 03/26/2018   EGD 03/26/18 shows gastritis and hiatal hernia 4cm  . Hypertension   . Sickle cell anemia (HCC)    trait     Family History  Problem Relation Age of Onset  . Hypertension Father   . Diabetes Sister   . Diabetes Brother   . Colon cancer Neg Hx   . Esophageal cancer Neg Hx   . Rectal cancer Neg Hx   . Stomach cancer Neg Hx      Past Surgical History:  Procedure Laterality Date  . UPPER GASTROINTESTINAL ENDOSCOPY  03/26/2018    Social History   Socioeconomic History  . Marital status: Married    Spouse name: Not on file  . Number of children: Not on file  . Years of education: Not on file  . Highest education level: Not on file  Occupational History  . Not on file  Social  Needs  . Financial resource strain: Not on file  . Food insecurity:    Worry: Not on file    Inability: Not on file  . Transportation needs:    Medical: Not on file    Non-medical: Not on file  Tobacco Use  . Smoking status: Never Smoker  . Smokeless tobacco: Never Used  Substance and Sexual Activity  . Alcohol use: Not Currently  . Drug use: Never  . Sexual activity: Not on file  Lifestyle  . Physical activity:    Days per week: Not on file    Minutes per session: Not on file  . Stress: Not on file  Relationships  . Social connections:    Talks on phone: Not on file    Gets together: Not on file    Attends religious service: Not on file    Active member of club or organization: Not on file    Attends meetings of clubs or organizations: Not on file     Relationship status: Not on file  . Intimate partner violence:    Fear of current or ex partner: Not on file    Emotionally abused: Not on file    Physically abused: Not on file    Forced sexual activity: Not on file  Other Topics Concern  . Not on file  Social History Narrative  . Not on file     Allergies  Allergen Reactions  . Tylenol [Acetaminophen] Rash  . Amlodipine Besylate Rash  . Lisinopril-Hydrochlorothiazide   . Bactrim [Sulfamethoxazole-Trimethoprim]     Rash   . Cephalexin   . Doxycycline     Rash and itching and lips feel burnt, no anaphylaxis  . Gentamicin Sulfate   . Shellfish Allergy   . Nsaids Rash  . Penicillins Rash and Other (See Comments)    Burning   Has patient had a PCN reaction causing immediate rash, facial/tongue/throat swelling, SOB or lightheadedness with hypotension: Yes Has patient had a PCN reaction causing severe rash involving mucus membranes or skin necrosis: No Has patient had a PCN reaction that required hospitalization: No Has patient had a PCN reaction occurring within the last 10 years: No If all of the above answers are "NO", then may proceed with Cephalosporin use.   . Tetracyclines & Related Rash     Outpatient Medications Prior to Visit  Medication Sig Dispense Refill  . Ascorbic Acid (VITAMIN C PO) Take 1 tablet by mouth daily.     . bisacodyl (DULCOLAX) 5 MG EC tablet Take 5 mg by mouth once.    . Continuous Blood Gluc Receiver (FREESTYLE LIBRE 14 DAY READER) DEVI 1 Device by Does not apply route QID. Use to check blood glucose 3 times a day 1 Device 0  . Continuous Blood Gluc Sensor (FREESTYLE LIBRE 14 DAY SENSOR) MISC 1 Device by Does not apply route every 14 (fourteen) days. 2 each 6  . dextromethorphan (DELSYM) 30 MG/5ML liquid Take 5 mLs (30 mg total) by mouth 2 (two) times daily as needed for cough. 89 mL 0  . glucose blood test strip Use as instructed 100 each 12  . loratadine (CLARITIN) 10 MG tablet TAKE 1 TABLET BY  MOUTH EVERY DAY (Patient taking differently: Take 10 mg by mouth daily. ) 30 tablet 1  . loratadine (CLARITIN) 10 MG tablet TAKE 1 TABLET BY MOUTH EVERY DAY 90 tablet 0  . losartan (COZAAR) 50 MG tablet Take 1 tablet (50 mg total) by mouth daily. 30 tablet  11  . metFORMIN (GLUCOPHAGE) 1000 MG tablet Take 1 tablet (1,000 mg total) by mouth 2 (two) times daily with a meal. 60 tablet 3  . metoprolol tartrate (LOPRESSOR) 25 MG tablet Take 0.5 tablets (12.5 mg total) by mouth 2 (two) times daily. 60 tablet 0  . polyethylene glycol powder (MIRALAX) powder Take 1 Container by mouth once.    Marland Kitchen omeprazole (PRILOSEC) 20 MG capsule Take 1 capsule (20 mg total) by mouth 2 (two) times daily. 30 capsule 3  . chlorpheniramine-HYDROcodone (TUSSIONEX) 10-8 MG/5ML SUER Take 5 mLs by mouth every 12 (twelve) hours as needed for cough (if guaifenesin not effective). Or if Delsym is not effective. (Patient not taking: Reported on 07/15/2018) 1 Bottle 0   No facility-administered medications prior to visit.     Review of Systems  Constitutional: Negative.   HENT: Positive for congestion and sore throat. Negative for ear discharge, ear pain, hearing loss, nosebleeds, sinus pain and tinnitus.   Eyes: Negative.   Respiratory: Positive for cough, hemoptysis, shortness of breath and wheezing. Negative for sputum production and stridor.   Cardiovascular: Positive for chest pain and palpitations. Negative for orthopnea, claudication, leg swelling and PND.  Gastrointestinal: Negative.   Genitourinary: Negative.   Musculoskeletal: Negative.   Skin: Negative.   Neurological: Negative.   Endo/Heme/Allergies: Negative.   Psychiatric/Behavioral: Negative.      Objective:  Physical Exam  Constitutional: She is oriented to person, place, and time. She appears well-developed and well-nourished. No distress.  HENT:  Head: Normocephalic and atraumatic.  Mouth/Throat: Oropharynx is clear and moist.  Eyes: Pupils are equal,  round, and reactive to light. Conjunctivae are normal. No scleral icterus.  Neck: Neck supple. No JVD present. No tracheal deviation present.  Cardiovascular: Normal rate, regular rhythm, normal heart sounds and intact distal pulses.  No murmur heard. Pulmonary/Chest: Effort normal and breath sounds normal. No accessory muscle usage or stridor. No tachypnea. No respiratory distress. She has no wheezes. She has no rhonchi. She has no rales.  Abdominal: Soft. Bowel sounds are normal. She exhibits no distension. There is no tenderness.  Musculoskeletal: She exhibits no edema or tenderness.  Lymphadenopathy:    She has no cervical adenopathy.  Neurological: She is alert and oriented to person, place, and time.  Skin: Skin is warm and dry. Capillary refill takes less than 2 seconds. No rash noted.  Psychiatric: She has a normal mood and affect. Her behavior is normal.  Vitals reviewed.    Vitals:   07/15/18 1403  BP: 110/72  Pulse: 68  SpO2: 97%  Weight: 156 lb (70.8 kg)  Height: 5\' 2"  (1.575 m)   97% on RA BMI Readings from Last 3 Encounters:  07/15/18 28.53 kg/m  07/03/18 31.18 kg/m  06/24/18 31.48 kg/m   Wt Readings from Last 3 Encounters:  07/15/18 156 lb (70.8 kg)  07/03/18 165 lb (74.8 kg)  06/24/18 166 lb 9.6 oz (75.6 kg)     CBC    Component Value Date/Time   WBC 9.2 06/06/2018 0538   RBC 3.87 06/06/2018 0538   RBC 3.87 06/06/2018 0538   HGB 10.1 (L) 06/06/2018 0538   HCT 32.5 (L) 06/06/2018 0538   PLT 354 06/06/2018 0538   MCV 84.0 06/06/2018 0538   MCV 81.3 02/23/2018 1512   MCH 26.1 06/06/2018 0538   MCHC 31.1 06/06/2018 0538   RDW 12.9 06/06/2018 0538   LYMPHSABS 3.1 06/06/2018 0538   MONOABS 0.9 06/06/2018 0538   EOSABS 1.1 (H)  06/06/2018 0538   BASOSABS 0.1 06/06/2018 0538    Chest Imaging: 06/03/2018 chest x-ray: No active process The patient's images have been independently reviewed by me.    Pulmonary Functions Testing Results: No flowsheet  data found.  FeNO: None   Pathology: None   Echocardiogram: Normal ejection fraction 60 to 65%, grade 1 diastolic dysfunction.  Heart Catheterization: None    Assessment & Plan:   Dysphagia, unspecified type - Plan: SLP modified barium swallow  Cough - Plan: QuantiFERON-TB Gold Plus  Discussion:  This is a 62 year old female immigrant from [place], 13 years ago.  No prior TB exposures.  Was screened for TB on arrival to the [place].  This was negative per daughter.  Has been undergoing several evaluations for ongoing cough since June.  She was ill in June and ever since then being diagnosed with high blood pressure as well as diabetes has developed cough.  This is usually worse at nighttime.  She has been evaluated for reflux disease.  She had an EGD which demonstrated gastritis as well as to adenopathy and was placed on twice daily PPI during this time her cough was a little bit better on a more aggressive acid suppression.  She has since been decreased to once daily.  She does endorse symptoms consistent with dysphasia.  She has difficulty swallowing and occasionally has food hung in the back of her throat where she coughs to finally expel it.  She had an esophagram while she was in the hospital on her last hospitalization which was normal.  We will recommend the following: Recommendations given for GERD food diet choices Increased PPI to twice daily, omeprazole 20 mg twice daily Recommend elevation of the head of the bed while sleeping. We will screen for TB with serum QuantiFERON Will order MBSS for evaluation of swallow function Follow-up with one of our nurse practitioners to see if symptoms are improved in 4 weeks.  Greater than 50% of this patient's 60-minute office visit was spent face-to-face discussing the above recommendations and diagnostic/treatment approach.  Most of the time was obtained gleaning specific history regarding coughing episodes and surrounding  events.   Current Outpatient Medications:  .  Ascorbic Acid (VITAMIN C PO), Take 1 tablet by mouth daily. , Disp: , Rfl:  .  bisacodyl (DULCOLAX) 5 MG EC tablet, Take 5 mg by mouth once., Disp: , Rfl:  .  Continuous Blood Gluc Receiver (FREESTYLE LIBRE 14 DAY READER) DEVI, 1 Device by Does not apply route QID. Use to check blood glucose 3 times a day, Disp: 1 Device, Rfl: 0 .  Continuous Blood Gluc Sensor (FREESTYLE LIBRE 14 DAY SENSOR) MISC, 1 Device by Does not apply route every 14 (fourteen) days., Disp: 2 each, Rfl: 6 .  dextromethorphan (DELSYM) 30 MG/5ML liquid, Take 5 mLs (30 mg total) by mouth 2 (two) times daily as needed for cough., Disp: 89 mL, Rfl: 0 .  glucose blood test strip, Use as instructed, Disp: 100 each, Rfl: 12 .  loratadine (CLARITIN) 10 MG tablet, TAKE 1 TABLET BY MOUTH EVERY DAY (Patient taking differently: Take 10 mg by mouth daily. ), Disp: 30 tablet, Rfl: 1 .  loratadine (CLARITIN) 10 MG tablet, TAKE 1 TABLET BY MOUTH EVERY DAY, Disp: 90 tablet, Rfl: 0 .  losartan (COZAAR) 50 MG tablet, Take 1 tablet (50 mg total) by mouth daily., Disp: 30 tablet, Rfl: 11 .  metFORMIN (GLUCOPHAGE) 1000 MG tablet, Take 1 tablet (1,000 mg total) by mouth 2 (  two) times daily with a meal., Disp: 60 tablet, Rfl: 3 .  metoprolol tartrate (LOPRESSOR) 25 MG tablet, Take 0.5 tablets (12.5 mg total) by mouth 2 (two) times daily., Disp: 60 tablet, Rfl: 0 .  omeprazole (PRILOSEC) 20 MG capsule, Take 1 capsule (20 mg total) by mouth 2 (two) times daily., Disp: 60 capsule, Rfl: 5 .  polyethylene glycol powder (MIRALAX) powder, Take 1 Container by mouth once., Disp: , Rfl:  .  chlorpheniramine-HYDROcodone (TUSSIONEX) 10-8 MG/5ML SUER, Take 5 mLs by mouth every 12 (twelve) hours as needed for cough (if guaifenesin not effective). Or if Delsym is not effective. (Patient not taking: Reported on 07/15/2018), Disp: 1 Bottle, Rfl: 0   Josephine Igo, DO [facility] 07/15/2018 3:43 PM

## 2018-07-15 NOTE — Telephone Encounter (Signed)
PEC call.  Dtr upset pt hasnt received cough med yet. Dr. Creta LevinStallings needed approval from cardiologist to give Tussionex Chart states message sent over to Dr. Creta LevinStallings that it is ok to prescribe Tussionex  Discussed with Dr. Alvy BimlerSagardia in Dr. Creta LevinStallings' absence today. He approved sending Tussionex prescription in today.

## 2018-07-16 NOTE — Telephone Encounter (Signed)
Per interpreter have no options to leave a message. Voicemail has not been set up to inform pt that no note have been received from Cardiologist.

## 2018-07-17 ENCOUNTER — Ambulatory Visit: Payer: Self-pay | Admitting: Family Medicine

## 2018-07-17 LAB — QUANTIFERON-TB GOLD PLUS
Mitogen-NIL: >10 IU/mL
NIL: 0.04 IU/mL
QuantiFERON-TB Gold Plus: NEGATIVE
TB1-NIL: 0 [IU]/mL
TB2-NIL: 0 IU/mL

## 2018-07-23 ENCOUNTER — Encounter: Payer: Self-pay | Admitting: Gastroenterology

## 2018-07-24 ENCOUNTER — Telehealth: Payer: Self-pay | Admitting: Pulmonary Disease

## 2018-07-24 ENCOUNTER — Ambulatory Visit (HOSPITAL_COMMUNITY)
Admission: RE | Admit: 2018-07-24 | Discharge: 2018-07-24 | Disposition: A | Discharge status: Home / Self Care | Payer: Self-pay | Source: Ambulatory Visit | Attending: Pulmonary Disease | Admitting: Pulmonary Disease

## 2018-07-24 DIAGNOSIS — R1310 Dysphagia, unspecified: Secondary | ICD-10-CM | POA: Insufficient documentation

## 2018-07-24 DIAGNOSIS — I110 Hypertensive heart disease with heart failure: Secondary | ICD-10-CM | POA: Insufficient documentation

## 2018-07-24 DIAGNOSIS — I509 Heart failure, unspecified: Secondary | ICD-10-CM | POA: Insufficient documentation

## 2018-07-24 DIAGNOSIS — E119 Type 2 diabetes mellitus without complications: Secondary | ICD-10-CM | POA: Insufficient documentation

## 2018-07-24 NOTE — Telephone Encounter (Signed)
Call made to patient and daughter to make aware of Lab results, daughter states her mother had the Barium Swallow test done today and they told her that her mother was having spasms in her esophagus when she swallows. Daughter just wanted to make Dr. Tonia BroomsIcard. I informed the patient that I would [facility] but that he would need to review the results. Daughter voiced understanding. Nothing further is needed at this time.

## 2018-07-24 NOTE — Telephone Encounter (Signed)
PCCM:  Thanks I do not have the final results yet by radiology but I have reviewed the SLP comments and impression.  I will make sure her GI, Dr. Lonna Cobbirigilano knows about the result:  SLP impression:  Oral and oropharyngeal function WNL, no aspiration. Though most solids boluses passed the esophagus with appearance of good motility and pill passed without incident, 2-3 well masticated solid (puree and cereal bar) boluses would not pass from proximal to mid esophgus and elicited coughing and gagging (best seen on series 5). No radiologist present to confirm. Recommend pt continue current strategies with sof t foods, slow pace and f/u with GI if appropriate.    CC: Dr. Lonna Cobbirigilano  Thanks  Madeline IgoBradley L Amiah Frohlich, DO [facility] 07/24/2018 2:01 PM

## 2018-07-24 NOTE — Telephone Encounter (Signed)
Called patients daughter unable to reach left message to call back.

## 2018-07-24 NOTE — Progress Notes (Signed)
Objective Swallowing Evaluation: Type of Study: MBS-Modified Barium Swallow Study   Patient Details  Name: Madeline Bell Bomba MRN: [id number] Date of Birth: 04/02/1956  Today's Date: 07/24/2018 Time: SLP Start Time (ACUTE ONLY): 1130 -SLP Stop Time (ACUTE ONLY): 1200  SLP Time Calculation (min) (ACUTE ONLY): 30 min   Past Medical History:  Past Medical History:  Diagnosis Date  . CHF (congestive heart failure) (HCC)   . Diabetes mellitus without complication (HCC)   . Dysphagia    esophageal dysphagia  . GERD (gastroesophageal reflux disease)   . Heart murmur   . Hiatal hernia 03/26/2018   EGD 03/26/18 shows gastritis and hiatal hernia 4cm  . Hypertension   . Sickle cell anemia (HCC)    trait   Past Surgical History:  Past Surgical History:  Procedure Laterality Date  . UPPER GASTROINTESTINAL ENDOSCOPY  03/26/2018   HPI: Per Pulmonologist on 12/2 "This is a 62 year old female immigrant from [place], 13 years ago.   Has been undergoing several evaluations for ongoing cough since June.  She was ill in June and ever since then being diagnosed with high blood pressure as well as diabetes has developed cough.  This is usually worse at nighttime.  She has been evaluated for reflux disease.  She had an EGD which demonstrated gastritis as well as to adenopathy and was placed on twice daily PPI during this time her cough was a little bit better on a more aggressive acid suppression.  She has since been decreased to once daily.  She does endorse symptoms consistent with dysphasia.  She has difficulty swallowing and occasionally has food hung in the back of her throat where she coughs to finally expel it.  She had an esophagram while she was in the hospital on her last hospitalization which was normal"   Subjective: pt awake in bed    Assessment / Plan / Recommendation  CHL IP CLINICAL IMPRESSIONS 07/24/2018  Clinical Impression Oral and oropharyngeal function WNL, no aspiration. Though most solids  boluses passed the esophagus with appearance of good motility and pill passed without incident, 2-3 well masticated solid (puree and cereal bar) boluses would not pass from proximal to mid esophgus and elicited coughing and gagging (best seen on series 5). No radiologist present to confirm. Recommend pt continue current strategies with sof t foods, slow pace and f/u with GI if appropriate.   SLP Visit Diagnosis Dysphagia, unspecified (R13.10)  Attention and concentration deficit following --  Frontal lobe and executive function deficit following --  Impact on safety and function --      CHL IP TREATMENT RECOMMENDATION 07/24/2018  Treatment Recommendations No treatment recommended at this time     No flowsheet data found.  CHL IP DIET RECOMMENDATION 07/24/2018  SLP Diet Recommendations Dysphagia 3 (Mech soft) solids;Thin liquid  Liquid Administration via Cup;Straw  Medication Administration Whole meds with liquid  Compensations --  Postural Changes --      No flowsheet data found.    CHL IP FOLLOW UP RECOMMENDATIONS 07/24/2018  Follow up Recommendations None      No flowsheet data found.         CHL IP ORAL PHASE 07/24/2018  Oral Phase WFL  Oral - Pudding Teaspoon --  Oral - Pudding Cup --  Oral - Honey Teaspoon --  Oral - Honey Cup --  Oral - Nectar Teaspoon --  Oral - Nectar Cup --  Oral - Nectar Straw --  Oral - Thin Teaspoon --  Oral -  Thin Cup --  Oral - Thin Straw --  Oral - Puree --  Oral - Mech Soft --  Oral - Regular --  Oral - Multi-Consistency --  Oral - Pill --  Oral Phase - Comment --    CHL IP PHARYNGEAL PHASE 07/24/2018  Pharyngeal Phase WFL  Pharyngeal- Pudding Teaspoon --  Pharyngeal --  Pharyngeal- Pudding Cup --  Pharyngeal --  Pharyngeal- Honey Teaspoon --  Pharyngeal --  Pharyngeal- Honey Cup --  Pharyngeal --  Pharyngeal- Nectar Teaspoon --  Pharyngeal --  Pharyngeal- Nectar Cup --  Pharyngeal --  Pharyngeal- Nectar Straw --   Pharyngeal --  Pharyngeal- Thin Teaspoon --  Pharyngeal --  Pharyngeal- Thin Cup --  Pharyngeal --  Pharyngeal- Thin Straw --  Pharyngeal --  Pharyngeal- Puree --  Pharyngeal --  Pharyngeal- Mechanical Soft --  Pharyngeal --  Pharyngeal- Regular --  Pharyngeal --  Pharyngeal- Multi-consistency --  Pharyngeal --  Pharyngeal- Pill --  Pharyngeal --  Pharyngeal Comment --     CHL IP CERVICAL ESOPHAGEAL PHASE 07/24/2018  Cervical Esophageal Phase WFL  Pudding Teaspoon --  Pudding Cup --  Honey Teaspoon --  Honey Cup --  Nectar Teaspoon --  Nectar Cup --  Nectar Straw --  Thin Teaspoon --  Thin Cup --  Thin Straw --  Puree --  Mechanical Soft --  Regular --  Multi-consistency --  Pill --  Cervical Esophageal Comment --    Harlon Ditty, MA CCC-SLP  Acute Rehabilitation Services Pager [phone] Office [phone]  Claudine Mouton 07/24/2018, 1:10 PM

## 2018-07-24 NOTE — Telephone Encounter (Signed)
Received message from Dr. Tonia BroomsIcard re: recent swallow eval. Reviewed SLP comments. Can you please set up a routine f/u appt with me in clinic that we can review in detail with her. She previously had an EGD with dilation, so we will plan on discussed conservative management with dietary modifications per SLP recommendations vs referral for Esophageal Manometry at that appointment. Thanks!

## 2018-07-25 NOTE — Telephone Encounter (Signed)
Patient scheduled for a follow up appt on 08/02/18 @ 9:45 am per MD recommendations;

## 2018-07-26 NOTE — Telephone Encounter (Signed)
Call made to patient daughter Shon HoughJin to ensure she was aware of upcoming appointment as well as to see if she needed anything else. Voiced understanding. Nothing further is needed at this time.

## 2018-08-01 ENCOUNTER — Other Ambulatory Visit: Payer: Self-pay | Admitting: Family Medicine

## 2018-08-01 NOTE — Telephone Encounter (Signed)
Copied from CRM [phone] [id number]. Topic: Quick Communication - Rx Refill/Question >> Aug 01, 2018  9:32 AM Lynne LoganHudson, Caryn D wrote: Medication: chlorpheniramine-HYDROcodone (TUSSIONEX) 10-8 MG/5ML SUER   Has the patient contacted their pharmacy? Yes.   (Agent: If no, request that the patient contact the pharmacy for the refill.) (Agent: If yes, when and what did the pharmacy advise?)  Preferred Pharmacy (with phone number or street name): [facility] [phone][id number] Ginette Otto- [place], [place] - [phone] (Phone) [phone] (Fax)    Agent: Please Chayse advised that RX refills may take up to 3 business days. We ask that you follow-up with your pharmacy.

## 2018-08-01 NOTE — Telephone Encounter (Signed)
Attempted to call patient, using an interpreter. Could not get an interpreter for the language montagnard Rhade. Tried to call the her daughter and she stated that her mom is still taking the Tussionex. It is the only thing that helps.

## 2018-08-01 NOTE — Telephone Encounter (Signed)
Requested medication (s) are due for refill today: ? Pt is out  Requested medication (s) are on the active medication list: yes  Last refill:  07/15/18 for 1 bottle  Future visit scheduled: yes  Notes to clinic:  Controlled medication. Off protocol failed.  Requested Prescriptions  Pending Prescriptions Disp Refills   chlorpheniramine-HYDROcodone (TUSSIONEX) 10-8 MG/5ML SUER 1 Bottle 0    Sig: Take 5 mLs by mouth every 12 (twelve) hours as needed for cough (if guaifenesin not effective). Or if Delsym is not effective.     Off-Protocol Failed - 08/01/2018 11:38 AM      Failed - Medication not assigned to a protocol, review manually.      Passed - Valid encounter within last 12 months    Recent Outpatient Visits          1 month ago Cough syncope   [facility], [place], [place]   3 months ago Type 2 diabetes mellitus with hyperglycemia, without long-term current use of insulin [facility](HCC)   [facility], Manus RuddZoe A, MD   4 months ago Essential hypertension   [facility], Asencion PartridgeJeffrey R, MD   4 months ago Essential hypertension   [facility], Manus RuddZoe A, MD   5 months ago Cough   [facility], Asencion PartridgeJeffrey R, MD      Future Appointments            In 1 week Glenford BayleyWalsh, Elizabeth W, NP [facility]   In 3 weeks Sunnie NielsenAlexander, Natalie, DO [facility]

## 2018-08-01 NOTE — Telephone Encounter (Signed)
Please advise 

## 2018-08-02 ENCOUNTER — Ambulatory Visit: Payer: Self-pay | Admitting: Gastroenterology

## 2018-08-02 MED ORDER — HYDROCOD POLST-CPM POLST ER 10-8 MG/5ML PO SUER: 5.0000 mL | Freq: Two times a day (BID) | ORAL | 0 refills | Status: DC | PRN

## 2018-08-09 ENCOUNTER — Telehealth: Payer: Self-pay

## 2018-08-09 NOTE — Telephone Encounter (Signed)
-----   Message from Jodelle RedBridgette Christopher, MD sent at 08/02/2018 10:30 AM EST ----- Monitor showed multiple episodes of SVT. Options are to treat this with medication (I would do this by increasing the dose of metoprolol to 25 mg BID) or to refer her to EP for ablation. There were no pauses to suggest she is having low heart rates as a reason for her passing out. She is also welcome to come back to the office to discuss more in person.

## 2018-08-09 NOTE — Telephone Encounter (Signed)
Pt's son updated with monitor results along with Dr. Di Kindlehristopher's recommendation. Son verbalized understanding and states he will discuss options with mother and call back.

## 2018-08-12 ENCOUNTER — Encounter: Payer: Self-pay | Admitting: Primary Care

## 2018-08-12 ENCOUNTER — Ambulatory Visit (INDEPENDENT_AMBULATORY_CARE_PROVIDER_SITE_OTHER): Payer: Self-pay | Admitting: Primary Care

## 2018-08-12 VITALS — BP 120/78 | HR 76 | Temp 98.0°F | Ht 61.0 in | Wt 164.8 lb

## 2018-08-12 DIAGNOSIS — R1319 Other dysphagia: Secondary | ICD-10-CM | POA: Insufficient documentation

## 2018-08-12 DIAGNOSIS — R05 Cough: Secondary | ICD-10-CM

## 2018-08-12 DIAGNOSIS — R059 Cough, unspecified: Secondary | ICD-10-CM

## 2018-08-12 DIAGNOSIS — R1310 Dysphagia, unspecified: Secondary | ICD-10-CM

## 2018-08-12 MED ORDER — HYDROCOD POLST-CPM POLST ER 10-8 MG/5ML PO SUER: 5.0000 mL | Freq: Two times a day (BID) | ORAL | 0 refills | Status: DC | PRN

## 2018-08-12 NOTE — Assessment & Plan Note (Addendum)
-   Most consistent with dysphagia symptoms - Lungs clear on exam, O2 sat 96% RA  - CXR in October normal - Unable to complete FENO  - Quantiferon Gold negative  - Rx Tussionex, reviewed that this will Simrah her last refill and encouraged tapering off (PMP awareness tool accessed, last filled 08/02/18 for 12 days worth)  - Needs GI follow-up, if cough persists would then recommend further work-up

## 2018-08-12 NOTE — Assessment & Plan Note (Addendum)
-   Barrium swallow showed normal oral and oropharyngeal function, no aspiration. 2-3 well masticated solid boluses would not pass from proximal to mid esophagus and elicited coughing and gagging - Needs to reschedule follow-up with Dr. Lonna Cobbirigilano

## 2018-08-12 NOTE — Progress Notes (Signed)
PCCM:  Agree. Thanks for seeing.   Josephine IgoBradley L Amea Mcphail, DO [facility] 08/12/2018 3:05 PM

## 2018-08-12 NOTE — Patient Instructions (Addendum)
Please reschedule apt with GI, Dr. Lonna Cobbirigilano re: dysphagia   Continue Omeprazole twice daily  One refill of hycodan cough syrup to Madeline Bell used sparingly (do not drive while taking or combine with other sedation medication or alcohol)  Follow-up with Dr. Tonia BroomsIcard in 3 months or if symptoms do not improve     Aspiration Precautions, Adult Aspiration is the breathing in (inhalation) of a liquid or object into the lungs. Things that can Madeline Bell inhaled into the lungs include:  Food.  Any type of liquid, such as drinks or saliva.  Stomach contents, such as vomit or stomach acid. What are the signs of aspiration? Signs of aspiration include:  Coughing after swallowing food or liquids.  Clearing the throat often while eating.  Trouble breathing. This may include: ? Breathing quickly. ? Breathing very slowly. ? Loud breathing. ? Rumbling sounds from the lungs while breathing.  Coughing up phlegm (sputum) that: ? Is yellow, tan, or green. ? Has pieces of food in it. ? Is bad-smelling.  Having a hoarse, barky cough.  Not being able to speak.  A hoarse voice.  Drooling while eating.  A feeling of fullness in the throat or a feeling that something is stuck in the throat.  Choking often.  Having a runny noise while eating.  Coughing when lying down or having to sit up quickly after lying down.  A change in skin color. The skin may look red or blue.  Fever.  Watery eyes.  Pain in the chest or back.  A pained look on the face. What are the complications of aspiration? Complications of aspiration include:  Losing weight because the person is not absorbing needed nutrients.  Loss of enjoyment and the social benefits of eating.  Choking.  Lung irritation, if someone aspirates acidic food or drinks.  Lung infection (pneumonia).  Collection of infected liquid (pus) in the lungs (lung abscess). In serious cases, death can occur. What can I do to prevent aspiration? Caring  for someone who has a feeding tube If you are caring for someone who has a feeding tube who cannot eat or drink safely through his or her mouth:  Keep the person in an upright position as much as possible.  Do not lay the person flat if he or she is getting continuous feedings. If you need to lay the person flat for any reason, turn the feeding pump off.  Check feeding tube residuals as told by your health care provider. Ask your health care provider what residual amount is too high. Caring for someone who can eat and drink safely by mouth If you are caring for someone who can eat and drink safely through his or her mouth:  Have the person sit in an upright position when eating food or drinking fluids. This can Madeline Bell done in two ways: ? Have the person sit up in a chair. ? If sitting in a chair is not possible, position the person in bed so he or she is upright.  Remind the person to eat slowly and chew well. Make sure the person is awake and alert while eating.  Do not distract the person. This is especially important for people with thinking or memory (cognitive) problems.  Allow foods to cool. Hot foods may Madeline Bell more difficult to swallow.  Provide small meals more frequently, instead of 3 large meals. This may reduce fatigue during eating.  Check the person's mouth thoroughly for leftover food after eating.  Keep the person sitting upright  for 30-45 minutes after eating.  Do not serve food or drink during 2 hours or more before bedtime. General instructions Follow these general guidelines to prevent aspiration in someone who can eat and drink safely by mouth:  Never put food or liquids in the mouth of a person who is not fully alert.  Feed small amounts of food. Do not force feed.  For a person who is on a diet for swallowing difficulty (dysphagia diet), follow the recommended food and drink consistency. For example, in dysphagia diet level 1, thicken liquids to pudding-like  consistency.  Use as little water as possible when brushing the person's teeth or cleaning his or her mouth.  Provide oral care before and after meals.  Use adaptive devices such as cut-out cups, straws, or utensils as told by the health care provider.  Crush pills and put them in soft food such as pudding or ice cream. Some pills should not Madeline Bell crushed. Check with the health care provider before crushing any medicine. Contact a health care provider if:  The person has a feeding tube, and the feeding tube residual amount is too high.  The person has a fever.  The person tries to avoid food or water, such as refusing to eat, drink, or Madeline Bell fed, or is eating less than normal.  The person may have aspirated food or liquid.  You notice warning signs, such as choking or coughing, when the person eats or drinks. Get help right away if:  The person has trouble breathing or starts to breathe quickly.  The person is breathing very slowly or stops breathing.  The person coughs a lot after eating or drinking.  The person has a long-lasting (chronic) cough.  The person coughs up thick, yellow, or tan sputum.  If someone is choking on food or an object, perform the Heimlich maneuver (abdominal thrusts).  The person has symptoms of pneumonia, such as: ? Coughing a lot. ? Coughing up mucus with a bad smell or blood in it. ? Feeling short of breath. ? Complaining of chest pain. ? Sweating, fever, and chills. ? Feeling tired. ? Complaining of trouble breathing. ? Wheezing.  The person cannot stop choking.  The person is unable to breathe, turns blue, faints, or seems confused. These symptoms may represent a serious problem that is an emergency. Do not wait to see if the symptoms will go away. Get medical help right away. Call your local emergency services (911 in the [place]  Summary  Aspiration is the breathing in (inhalation) of a liquid or object into the lungs. Things that can Madeline Bell  inhaled into the lungs include food, liquids, saliva, or stomach contents.  Aspiration can cause pneumonia or choking.  One sign of aspiration is coughing after swallowing food or liquids.  Contact a health care provider if you notice signs of aspiration. This information is not intended to replace advice given to you by your health care provider. Make sure you discuss any questions you have with your health care provider. Document Released: 09/02/2010 Document Revised: 04/27/2016 Document Reviewed: 04/27/2016 Elsevier Interactive Patient Education  [employer]2019 Elsevier Inc.

## 2018-08-12 NOTE — Progress Notes (Signed)
 @Patient  ID: Madeline Bell, female    DOB: 10/04/1955, 62 y.o.   MRN: [id number]  Chief Complaint  Patient presents with  . Follow-up    cough with white mucus, occasional SOB and soreness with coughing    Referring provider: Doristine BosworthStallings, Zoe A, MD  HPI: 62 year old female, never smoked. PMH significant for diastolic CHF, DM, GERD, HTN, sickle cell anemia, hiatal hernia. Patient of Dr. Tonia BroomsIcard, seen for initial consult on 07/15/18 for cough and dysphagia.   Barrium swallow showed normal oral and oropharyngeal function, no aspiration. 2-3 well masticated solid boluses would not pass from proximal to mid esophagus and elicited coughing and gagging. GI doctor is Cirigilano, routine follow-up with him scheduled for December 20th. She previously had an EGD with dilation.   08/12/2018 Patient presents today for follow-up for cough. Accompanied by granddaughter whom helps provide translation. Patient has been fine while taking prescription cough medication. Trouble swallowing and eating when she coughs. Cough can Hedy random, has happened when lying down with head elevated. Continues taking omperazole twice daily. Unfortunately, they cancelled her GI apt because she did not have transportation. Unable to complete FENO today. Normal CXR in October.    Allergies  Allergen Reactions  . Tylenol [Acetaminophen] Rash  . Amlodipine Besylate Rash  . Lisinopril-Hydrochlorothiazide   . Bactrim [Sulfamethoxazole-Trimethoprim]     Rash   . Cephalexin   . Doxycycline     Rash and itching and lips feel burnt, no anaphylaxis  . Gentamicin Sulfate   . Shellfish Allergy   . Nsaids Rash  . Penicillins Rash and Other (See Comments)    Burning   Has patient had a PCN reaction causing immediate rash, facial/tongue/throat swelling, SOB or lightheadedness with hypotension: Yes Has patient had a PCN reaction causing severe rash involving mucus membranes or skin necrosis: No Has patient had a PCN reaction that required  hospitalization: No Has patient had a PCN reaction occurring within the last 10 years: No If all of the above answers are "NO", then may proceed with Cephalosporin use.   . Tetracyclines & Related Rash    Immunization History  Administered Date(s) Administered  . Tdap 03/08/2018    Past Medical History:  Diagnosis Date  . CHF (congestive heart failure) (HCC)   . Diabetes mellitus without complication (HCC)   . Dysphagia    esophageal dysphagia  . GERD (gastroesophageal reflux disease)   . Heart murmur   . Hiatal hernia 03/26/2018   EGD 03/26/18 shows gastritis and hiatal hernia 4cm  . Hypertension   . Sickle cell anemia (HCC)    trait    Tobacco History: Social History   Tobacco Use  Smoking Status Never Smoker  Smokeless Tobacco Never Used   Counseling given: Not Answered   Outpatient Medications Prior to Visit  Medication Sig Dispense Refill  . Ascorbic Acid (VITAMIN C PO) Take 1 tablet by mouth daily.     . bisacodyl (DULCOLAX) 5 MG EC tablet Take 5 mg by mouth once.    . Continuous Blood Gluc Receiver (FREESTYLE LIBRE 14 DAY READER) DEVI 1 Device by Does not apply route QID. Use to check blood glucose 3 times a day 1 Device 0  . Continuous Blood Gluc Sensor (FREESTYLE LIBRE 14 DAY SENSOR) MISC 1 Device by Does not apply route every 14 (fourteen) days. 2 each 6  . dextromethorphan (DELSYM) 30 MG/5ML liquid Take 5 mLs (30 mg total) by mouth 2 (two) times daily as needed for cough.  89 mL 0  . glucose blood test strip Use as instructed 100 each 12  . loratadine (CLARITIN) 10 MG tablet TAKE 1 TABLET BY MOUTH EVERY DAY (Patient taking differently: Take 10 mg by mouth daily. ) 30 tablet 1  . loratadine (CLARITIN) 10 MG tablet TAKE 1 TABLET BY MOUTH EVERY DAY 90 tablet 0  . losartan (COZAAR) 50 MG tablet Take 1 tablet (50 mg total) by mouth daily. 30 tablet 11  . metFORMIN (GLUCOPHAGE) 1000 MG tablet Take 1 tablet (1,000 mg total) by mouth 2 (two) times daily with a meal. 60  tablet 3  . metoprolol tartrate (LOPRESSOR) 25 MG tablet Take 0.5 tablets (12.5 mg total) by mouth 2 (two) times daily. 60 tablet 0  . omeprazole (PRILOSEC) 20 MG capsule Take 1 capsule (20 mg total) by mouth 2 (two) times daily. 60 capsule 5  . polyethylene glycol powder (MIRALAX) powder Take 1 Container by mouth once.    . chlorpheniramine-HYDROcodone (TUSSIONEX) 10-8 MG/5ML SUER Take 5 mLs by mouth every 12 (twelve) hours as needed for cough (if guaifenesin not effective). Or if Delsym is not effective. 1 Bottle 0   No facility-administered medications prior to visit.     Review of Systems  Review of Systems  Constitutional: Negative.   HENT: Positive for trouble swallowing.   Respiratory: Positive for cough.   Cardiovascular: Negative.     Physical Exam  BP 120/78 (BP Location: Right Arm, Cuff Size: Normal)   Pulse 76   Temp 98 F (36.7 C)   Ht 5\' 1"  (1.549 m)   Wt 164 lb 12.8 oz (74.8 kg)   SpO2 96%   BMI 31.14 kg/m  Physical Exam Constitutional:      Appearance: Normal appearance. She is not ill-appearing.  HENT:     Head: Normocephalic and atraumatic.     Nose: Nose normal.     Mouth/Throat:     Mouth: Mucous membranes are moist.     Pharynx: Oropharynx is clear.  Eyes:     Extraocular Movements: Extraocular movements intact.     Pupils: Pupils are equal, round, and reactive to light.  Neck:     Musculoskeletal: Normal range of motion and neck supple.  Cardiovascular:     Rate and Rhythm: Normal rate and regular rhythm.     Comments: No edema  Pulmonary:     Effort: Pulmonary effort is normal. No respiratory distress.     Breath sounds: Normal breath sounds. No stridor. No wheezing, rhonchi or rales.     Comments: CTA  Musculoskeletal: Normal range of motion.  Skin:    General: Skin is warm and dry.  Neurological:     Mental Status: She is alert.  Psychiatric:        Mood and Affect: Mood normal.        Behavior: Behavior normal.        Thought  Content: Thought content normal.        Judgment: Judgment normal.      Lab Results:  CBC    Component Value Date/Time   WBC 9.2 06/06/2018 0538   RBC 3.87 06/06/2018 0538   RBC 3.87 06/06/2018 0538   HGB 10.1 (L) 06/06/2018 0538   HCT 32.5 (L) 06/06/2018 0538   PLT 354 06/06/2018 0538   MCV 84.0 06/06/2018 0538   MCV 81.3 02/23/2018 1512   MCH 26.1 06/06/2018 0538   MCHC 31.1 06/06/2018 0538   RDW 12.9 06/06/2018 0538   LYMPHSABS 3.1  06/06/2018 0538   MONOABS 0.9 06/06/2018 0538   EOSABS 1.1 (H) 06/06/2018 0538   BASOSABS 0.1 06/06/2018 0538    BMET    Component Value Date/Time   NA 141 06/06/2018 0538   NA 141 02/20/2018 1012   K 4.1 06/06/2018 0538   CL 108 06/06/2018 0538   CO2 27 06/06/2018 0538   GLUCOSE 142 (H) 06/06/2018 0538   BUN 11 06/06/2018 0538   BUN 5 (L) 02/20/2018 1012   CREATININE 0.71 06/06/2018 0538   CREATININE 0.53 03/27/2014 1435   CALCIUM 8.7 (L) 06/06/2018 0538   GFRNONAA >60 06/06/2018 0538   GFRNONAA >89 03/27/2014 1435   GFRAA >60 06/06/2018 0538   GFRAA >89 03/27/2014 1435    BNP No results found for: BNP  ProBNP    Component Value Date/Time   PROBNP 12 02/23/2018 1520    Imaging: No results found.   Assessment & Plan:   Esophageal dysphagia - Barrium swallow showed normal oral and oropharyngeal function, no aspiration. 2-3 well masticated solid boluses would not pass from proximal to mid esophagus and elicited coughing and gagging - Needs to reschedule follow-up with Dr. Lonna Cobb   Cough - Most consistent with dysphagia symptoms - Lungs clear on exam, O2 sat 96% RA  - CXR in October normal - Unable to complete FENO  - Quantiferon Gold negative  - Rx Tussionex, reviewed that this will Saira her last refill and encouraged tapering off (PMP awareness tool accessed, last filled 08/02/18 for 12 days worth)  - Needs GI follow-up, if cough persists would then recommend further work-up  Glenford Bayley,  NP 08/12/2018

## 2018-08-15 ENCOUNTER — Telehealth: Payer: Self-pay | Admitting: Family Medicine

## 2018-08-15 NOTE — Telephone Encounter (Signed)
Requested medication (s) are due for refill today -yes  Requested medication (s) are on the active medication list -yes  Future visit scheduled -no  Last refill: 06/06/18  Notes to clinic: Patient is requesting a refill on medication that was last filled by outside provider- possible hospital provider.Sent for PCP review.  Requested Prescriptions  Pending Prescriptions Disp Refills   metoprolol tartrate (LOPRESSOR) 25 MG tablet 60 tablet 0    Sig: Take 0.5 tablets (12.5 mg total) by mouth 2 (two) times daily.     Cardiovascular:  Beta Blockers Passed - 08/15/2018 10:03 AM      Passed - Last BP in normal range    BP Readings from Last 1 Encounters:  08/12/18 120/78         Passed - Last Heart Rate in normal range    Pulse Readings from Last 1 Encounters:  08/12/18 76         Passed - Valid encounter within last 6 months    Recent Outpatient Visits          1 month ago Cough syncope   Primary Care at [facility], [place] A, MD   4 months ago Type 2 diabetes mellitus with hyperglycemia, without long-term current use of insulin [facility])   Primary Care at [facility], Manus Rudd, MD   5 months ago Essential hypertension   Primary Care at [facility], Asencion Partridge, MD   5 months ago Essential hypertension   Primary Care at [facility], [place] A, MD   5 months ago Cough   Primary Care at [facility], Asencion Partridge, MD      Future Appointments            In 1 week Sunnie Nielsen, DO Primary Care at [place], [facility]            Requested Prescriptions  Pending Prescriptions Disp Refills   metoprolol tartrate (LOPRESSOR) 25 MG tablet 60 tablet 0    Sig: Take 0.5 tablets (12.5 mg total) by mouth 2 (two) times daily.     Cardiovascular:  Beta Blockers Passed - 08/15/2018 10:03 AM      Passed - Last BP in normal range    BP Readings from Last 1 Encounters:  08/12/18 120/78         Passed - Last Heart Rate in normal range    Pulse Readings from Last 1 Encounters:  08/12/18  76         Passed - Valid encounter within last 6 months    Recent Outpatient Visits          1 month ago Cough syncope   Primary Care at [facility], [place] A, MD   4 months ago Type 2 diabetes mellitus with hyperglycemia, without long-term current use of insulin [facility])   Primary Care at [facility], Manus Rudd, MD   5 months ago Essential hypertension   Primary Care at [facility], Asencion Partridge, MD   5 months ago Essential hypertension   Primary Care at [facility], Manus Rudd, MD   5 months ago Cough   Primary Care at [facility], Asencion Partridge, MD      Future Appointments            In 1 week Sunnie Nielsen, DO Primary Care at [place], [facility]

## 2018-08-15 NOTE — Telephone Encounter (Signed)
Copied from CRM [id number]. Topic: Quick Communication - Rx Refill/Question >> Aug 15, 2018  9:34 AM Gloriann Loan L wrote: Medication: metoprolol tartrate (LOPRESSOR) 25 MG tablet To fill medicine enough until appt on 08-24-18  Has the patient contacted their pharmacy? Yes.  Today went by pharmacy  (Agent: If no, request that the patient contact the pharmacy for the refill.) (Agent: If yes, when and what did the pharmacy advise?) to call provider  Preferred Pharmacy (with phone number or street name): [facility] [phone] [place], [place] - [address] [phone] (Phone) [phone] (Fax)    Agent: Please Euphemia advised that RX refills may take up to 3 business days. We ask that you follow-up with your pharmacy.

## 2018-08-19 NOTE — Telephone Encounter (Signed)
Requested medication (s) are due for refill today: yes  Requested medication (s) are on the active medication list: yes  Last refill:  06/06/18  Future visit scheduled: yes  Notes to clinic:  Pt was prescribed by pulmonologist but pt stated she has been out for the last 2 weeks   Requested Prescriptions  Pending Prescriptions Disp Refills   metoprolol tartrate (LOPRESSOR) 25 MG tablet 60 tablet 0    Sig: Take 0.5 tablets (12.5 mg total) by mouth 2 (two) times daily.     Cardiovascular:  Beta Blockers Passed - 08/19/2018  9:37 AM      Passed - Last BP in normal range    BP Readings from Last 1 Encounters:  08/12/18 120/78         Passed - Last Heart Rate in normal range    Pulse Readings from Last 1 Encounters:  08/12/18 76         Passed - Valid encounter within last 6 months    Recent Outpatient Visits          1 month ago Cough syncope   Primary Care at [facility], [place] A, MD   4 months ago Type 2 diabetes mellitus with hyperglycemia, without long-term current use of insulin [facility])   Primary Care at [facility], Manus Rudd, MD   5 months ago Essential hypertension   Primary Care at [facility], Asencion Partridge, MD   5 months ago Essential hypertension   Primary Care at [facility], Manus Rudd, MD   5 months ago Cough   Primary Care at [facility], Asencion Partridge, MD      Future Appointments            In 5 days Sunnie Nielsen, DO Primary Care at [place], [facility]

## 2018-08-19 NOTE — Telephone Encounter (Signed)
Pt's daughter Hedy Jacob called to follow up and see if refill will Eleena given on metoprolol. Please advise. Pt has been off medication for about 2 weeks.  CB#[phone]  [id number] [place], [place] - [address] [phone] (Phone) [phone] (Fax)

## 2018-08-22 ENCOUNTER — Telehealth: Payer: Self-pay | Admitting: Emergency Medicine

## 2018-08-22 ENCOUNTER — Encounter: Payer: Self-pay | Admitting: Gastroenterology

## 2018-08-22 ENCOUNTER — Other Ambulatory Visit: Payer: Self-pay | Admitting: Emergency Medicine

## 2018-08-22 ENCOUNTER — Ambulatory Visit (INDEPENDENT_AMBULATORY_CARE_PROVIDER_SITE_OTHER): Payer: Self-pay | Admitting: Gastroenterology

## 2018-08-22 VITALS — BP 146/82 | HR 74 | Ht 62.0 in | Wt 163.0 lb

## 2018-08-22 DIAGNOSIS — Z1211 Encounter for screening for malignant neoplasm of colon: Secondary | ICD-10-CM

## 2018-08-22 DIAGNOSIS — R1310 Dysphagia, unspecified: Secondary | ICD-10-CM

## 2018-08-22 DIAGNOSIS — K2100 Gastro-esophageal reflux disease with esophagitis, without bleeding: Secondary | ICD-10-CM

## 2018-08-22 DIAGNOSIS — D649 Anemia, unspecified: Secondary | ICD-10-CM

## 2018-08-22 DIAGNOSIS — I10 Essential (primary) hypertension: Secondary | ICD-10-CM

## 2018-08-22 MED ORDER — METOPROLOL TARTRATE 25 MG PO TABS: 12.5000 mg | ORAL_TABLET | Freq: Two times a day (BID) | ORAL | 0 refills | Status: DC

## 2018-08-22 NOTE — Progress Notes (Signed)
P  Chief Complaint:    Dysphagia, heartburn, cough  HPI:     Patient is a 63 y.o. female presenting to the Gastroenterology Clinic for follow-up.  She was initially seen by me on 03/25/2018 for new onset dysphagia, heartburn, cough.  She again presents with her daughter, Dennie Fetters, to assist in history and translation.  She was evaluated with EGD on 03/26/2018 which was notable for LA grade a esophagitis, 4 cm hiatal hernia, Hill grade 4 valve, non-H. pylori gastritis, mild duodenitis and duodenal bulb.  Treated with empiric 48 French Savary dilator without mucosal rent, tear, etc.  Discharged with Prilosec 20 mg p.o. twice daily x8 weeks with plan for repeat upper endoscopy in 8 weeks to evaluate for mucosal healing of erosive esophagitis and assess for clinical improvement of dysphagia.    Today, she states she is otherwise in her usual state of health.  Some improvement in dysphagia with EGD with dil in Aug, but has since returned with trouble with solids and some medications.  Requesting repeat EGD with repeat dilation.  Esophagram then performed in 05/2018 and negative for stricture or mass and 13 mm barium tablet easily traversing the esophagus.  Some stasis of contrast suggestive of nonspecific esophageal dysmotility.  MBS completed 07/24/2018 and most solid boluses passed the esophagus with appearance of good motility but 2-3 well masticated solid boluses would not pass from proximal to mid esophagus and elicited coughing and gagging.  Recommended continue soft foods, slow pace, and follow-up with GI as appropriate.  Was referred to Pulmonary Clinic for evaluation of chronic cough.  Working diagnosis was a cough was consistent with dysphasia/esophageal etiology.  Prior chest x-ray was normal and QuantiFERON gold negative.  Gave Rx for Tussionex with plan to taper off.  New from last time, she has developed a mild normocytic anemia with recent H/H 10.1/32.5 from baseline of 13/42 in  02/2018.  MCV mildly up trended from 81-84 with normal/stable RDW.  Otherwise normal recent CMP.  Normal B12 and folate.  Normal iron and ferritin.   Review of systems:     No chest pain, no SOB, no fevers, no urinary sx   Past Medical History:  Diagnosis Date  . CHF (congestive heart failure) (HCC)   . Diabetes mellitus without complication (HCC)   . Dysphagia    esophageal dysphagia  . GERD (gastroesophageal reflux disease)   . Heart murmur   . Hiatal hernia 03/26/2018   EGD 03/26/18 shows gastritis and hiatal hernia 4cm  . Hypertension   . Sickle cell anemia (HCC)    trait    Patient's surgical history, family medical history, social history, medications and allergies were all reviewed in Epic    Current Outpatient Medications  Medication Sig Dispense Refill  . Ascorbic Acid (VITAMIN C PO) Take 1 tablet by mouth daily.     . bisacodyl (DULCOLAX) 5 MG EC tablet Take 5 mg by mouth once.    . chlorpheniramine-HYDROcodone (TUSSIONEX) 10-8 MG/5ML SUER Take 5 mLs by mouth every 12 (twelve) hours as needed for cough (if guaifenesin not effective). Or if Delsym is not effective. 1 Bottle 0  . Continuous Blood Gluc Receiver (FREESTYLE LIBRE 14 DAY READER) DEVI 1 Device by Does not apply route QID. Use to check blood glucose 3 times a day 1 Device 0  . Continuous Blood Gluc Sensor (FREESTYLE LIBRE 14 DAY SENSOR) MISC 1 Device by Does not apply route every 14 (fourteen) days. 2 each 6  .  glucose blood test strip Use as instructed 100 each 12  . loratadine (CLARITIN) 10 MG tablet TAKE 1 TABLET BY MOUTH EVERY DAY (Patient taking differently: Take 10 mg by mouth daily. ) 30 tablet 1  . loratadine (CLARITIN) 10 MG tablet TAKE 1 TABLET BY MOUTH EVERY DAY 90 tablet 0  . losartan (COZAAR) 50 MG tablet Take 1 tablet (50 mg total) by mouth daily. 30 tablet 11  . metFORMIN (GLUCOPHAGE) 1000 MG tablet Take 1 tablet (1,000 mg total) by mouth 2 (two) times daily with a meal. 60 tablet 3  . metoprolol  tartrate (LOPRESSOR) 25 MG tablet Take 0.5 tablets (12.5 mg total) by mouth 2 (two) times daily. 60 tablet 0  . omeprazole (PRILOSEC) 20 MG capsule Take 1 capsule (20 mg total) by mouth 2 (two) times daily. 60 capsule 5   No current facility-administered medications for this visit.     Physical Exam:     Ht 5\' 2"  (1.575 m)   Wt 163 lb (73.9 kg)   BMI 29.81 kg/m   GENERAL:  Pleasant female in NAD PSYCH: : Cooperative, normal affect EENT:  conjunctiva pink, mucous membranes moist, neck supple without masses CARDIAC:  RRR, no murmur heard, no peripheral edema PULM: Normal respiratory effort, lungs CTA bilaterally, no wheezing ABDOMEN:  Nondistended, soft, nontender. No obvious masses, no hepatomegaly,  normal bowel sounds SKIN:  turgor, no lesions seen Musculoskeletal:  Normal muscle tone, normal strength NEURO: Alert and oriented x 3, no focal neurologic deficits   IMPRESSION and PLAN:    #1.  Dysphagia: Solid food dysphagia.  MBS with difficulty passing some well masticated boluses in the upper to mid esophagus.  -Repeat EGD with dilation after return from [place] (scheduled to leave later this month and prefers to wait till after return) - Resume high dose PPI - If no improvement with repeat dil, consider EM  #2.  GERD with Erosive Esophagitis: - Resume high-dose PPI and antireflux lifestyle measures -Will assess for mucosal improvement at repeat EGD  #3.  Normocytic anemia: No iron deficiency and no overt GI blood loss. -Recommend follow-up with her PCM for additional evaluation and discussion if referral to Hematology warranted  #4.  CRC screening: Discussed CRC screening to include diagnostic options.No prior colo, but wants to hold off pending tx of more acute issues above.  The indications, risks, and benefits of EGD were explained to the patient in detail. Risks include but are not limited to bleeding, perforation, adverse reaction to medications, and  cardiopulmonary compromise. Sequelae include but are not limited to the possibility of surgery, hositalization, and mortality. The patient verbalized understanding and wished to proceed. All questions answered, referred to scheduler. Further recommendations pending results of the exam.        Shellia CleverlyVito V Genesys Coggeshall ,DO, FACG 08/22/2018, 9:50 AM

## 2018-08-22 NOTE — Patient Instructions (Addendum)
If you are age 63 or older, your body mass index should Nia between 23-30. Your Body mass index is 29.81 kg/m. If this is out of the aforementioned range listed, please consider follow up with your Primary Care Provider.  If you are age 26 or younger, your body mass index should Isadora between 19-25. Your Body mass index is 29.81 kg/m. If this is out of the aformentioned range listed, please consider follow up with your Primary Care Provider.   Please call our office at [phone] to set up Endoscopy with Dilation when you get back from [place].  It was a pleasure to see you today!  Vito Cirigliano, D.O.

## 2018-08-22 NOTE — Telephone Encounter (Signed)
rx for Metformin has been refilled. Patient has an appt on 08/24/2018 no further refill without office visit

## 2018-08-22 NOTE — Telephone Encounter (Signed)
Called and spoke with the office stated they will contact the patient with a follow up.

## 2018-08-24 ENCOUNTER — Ambulatory Visit (INDEPENDENT_AMBULATORY_CARE_PROVIDER_SITE_OTHER): Payer: Self-pay | Admitting: Osteopathic Medicine

## 2018-08-24 ENCOUNTER — Encounter: Payer: Self-pay | Admitting: Osteopathic Medicine

## 2018-08-24 ENCOUNTER — Other Ambulatory Visit: Payer: Self-pay

## 2018-08-24 VITALS — BP 137/85 | HR 70 | Temp 98.0°F | Ht 62.0 in | Wt 163.0 lb

## 2018-08-24 DIAGNOSIS — R05 Cough: Secondary | ICD-10-CM

## 2018-08-24 DIAGNOSIS — R059 Cough, unspecified: Secondary | ICD-10-CM

## 2018-08-24 DIAGNOSIS — Z23 Encounter for immunization: Secondary | ICD-10-CM

## 2018-08-24 DIAGNOSIS — Z7184 Encounter for health counseling related to travel: Secondary | ICD-10-CM

## 2018-08-24 MED ORDER — ATOVAQUONE-PROGUANIL HCL 250-100 MG PO TABS: 1.0000 | ORAL_TABLET | Freq: Every day | ORAL | 0 refills | Status: DC

## 2018-08-24 MED ORDER — TYPHOID VACCINE PO CPDR: 1.0000 | DELAYED_RELEASE_CAPSULE | ORAL | 0 refills | Status: DC

## 2018-08-24 NOTE — Patient Instructions (Addendum)
The following prescriptions were sent to your pharmacy: Drugs to prevent malaria - start 2 days before you leave Pills for vaccine for typhoid - start now   Will request records from [facility] regarding vaccines  Will test blood today for immunity to Measles and Hepatitis B and update these vaccines if needed  Please call the pulmonologist with your questions about the cough medicine.          If you have lab work done today you will Hiilani contacted with your lab results within the next 2 weeks.  If you have not heard from [place] then please contact us. The fastest way to get your results is to register for My Chart.   IF you received an x-ray today, you will receive an invoice from [facility]. Please contact [facility] at [phone] with questions or concerns regarding your invoice.   IF you received labwork today, you will receive an invoice from [place]. Please contact [facility] at [phone] with questions or concerns regarding your invoice.   Our billing staff will not Ellionna able to assist you with questions regarding bills from these companies.  You will Elizette contacted with the lab results as soon as they are available. The fastest way to get your results is to activate your My Chart account. Instructions are located on the last page of this paperwork. If you have not heard from [place] regarding the results in 2 weeks, please contact this office.

## 2018-08-24 NOTE — Progress Notes (Signed)
HPI: Madeline Bell is a 63 y.o. female who  has a past medical history of CHF (congestive heart failure) (HCC), Diabetes mellitus without complication (HCC), Dysphagia, GERD (gastroesophageal reflux disease), Heart murmur, Hiatal hernia (03/26/2018), Hypertension, and Sickle cell anemia (HCC).  [facility] today, 08/24/18,  for chief complaint of: Chief Complaint  Patient presents with  . Travel Consult    going to [place] on 09/06/2018 for 3 weeks   . Cough    med refill     Cough - following with [place] pulmonology, last seen 08/12/18 by Ames DuraElizabeth Walsh PA-C who advised this was last fill of Tussionex, advised f/u GI Dr Barron Alvineirigliano who she saw 08/22/2018. Resumed high dose PPI and planning for repeat EGD when back from [place]. Pt today requests refill of cough syrup.   Travel: Was last in [place] 13 years ago.  Will Verda traveling to [place] in the [place] country.  Will Taniah spending time in both cities and rural areas.   Patient is accompanied by granddaughter who assists with history-taking.      Past medical, surgical, social and family history reviewed:  Patient Active Problem List   Diagnosis Date Noted  . Esophageal dysphagia 08/12/2018  . Cough 08/12/2018  . Diastolic dysfunction 07/03/2018  . Syncope 06/04/2018  . LBBB (left bundle branch block) 06/04/2018  . Hiatal hernia 03/26/2018  . Gastritis 03/26/2018  . HTN (hypertension), benign 05/02/2014    Past Surgical History:  Procedure Laterality Date  . UPPER GASTROINTESTINAL ENDOSCOPY  03/26/2018    Social History   Tobacco Use  . Smoking status: Never Smoker  . Smokeless tobacco: Never Used  Substance Use Topics  . Alcohol use: Not Currently    Family History  Problem Relation Age of Onset  . Hypertension Father   . Diabetes Sister   . Diabetes Brother   . Colon cancer Neg Hx   . Esophageal cancer Neg Hx   . Rectal cancer Neg Hx   . Stomach cancer Neg Hx       Current medication list and allergy/intolerance information reviewed:    Current Outpatient Medications  Medication Sig Dispense Refill  . Ascorbic Acid (VITAMIN C PO) Take 1 tablet by mouth daily.     . bisacodyl (DULCOLAX) 5 MG EC tablet Take 5 mg by mouth once.    . chlorpheniramine-HYDROcodone (TUSSIONEX) 10-8 MG/5ML SUER Take 5 mLs by mouth every 12 (twelve) hours as needed for cough (if guaifenesin not effective). Or if Delsym is not effective. 1 Bottle 0  . Continuous Blood Gluc Receiver (FREESTYLE LIBRE 14 DAY READER) DEVI 1 Device by Does not apply route QID. Use to check blood glucose 3 times a day 1 Device 0  . Continuous Blood Gluc Sensor (FREESTYLE LIBRE 14 DAY SENSOR) MISC 1 Device by Does not apply route every 14 (fourteen) days. 2 each 6  . glucose blood test strip Use as instructed 100 each 12  . loratadine (CLARITIN) 10 MG tablet TAKE 1 TABLET BY MOUTH EVERY DAY 90 tablet 0  . losartan (COZAAR) 50 MG tablet Take 1 tablet (50 mg total) by mouth daily. 30 tablet 11  . metFORMIN (GLUCOPHAGE) 1000 MG tablet Take 1 tablet (1,000 mg total) by mouth 2 (two) times daily with a meal. 60 tablet 3  . metoprolol tartrate (LOPRESSOR) 25 MG tablet Take 0.5 tablets (12.5 mg total) by mouth 2 (two) times daily. 30 tablet 0  . omeprazole (PRILOSEC)  20 MG capsule Take 1 capsule (20 mg total) by mouth 2 (two) times daily. 60 capsule 5   No current facility-administered medications for this visit.     Allergies  Allergen Reactions  . Tylenol [Acetaminophen] Rash  . Amlodipine Besylate Rash  . Lisinopril-Hydrochlorothiazide   . Bactrim [Sulfamethoxazole-Trimethoprim]     Rash   . Cephalexin   . Doxycycline     Rash and itching and lips feel burnt, no anaphylaxis  . Gentamicin Sulfate   . Shellfish Allergy   . Nsaids Rash  . Penicillins Rash and Other (See Comments)    Burning   Has patient had a PCN reaction causing immediate rash, facial/tongue/throat swelling, SOB or  lightheadedness with hypotension: Yes Has patient had a PCN reaction causing severe rash involving mucus membranes or skin necrosis: No Has patient had a PCN reaction that required hospitalization: No Has patient had a PCN reaction occurring within the last 10 years: No If all of the above answers are "NO", then may proceed with Cephalosporin use.   . Tetracyclines & Related Rash      Review of Systems:  Constitutional:  No recent illness,    HEENT: No  headache, no vision change, no hearing change, No sore throat, No  sinus pressure  Cardiac: No  chest pain  Resp: no SOB, +cough, chronic   Exam:  BP 137/85 (BP Location: Right Arm, Patient Position: Sitting, Cuff Size: Normal)   Pulse 70   Temp 98 F (36.7 C) (Oral)   Ht 5\' 2"  (1.575 m)   Wt 163 lb (73.9 kg)   SpO2 94%   BMI 29.81 kg/m   Constitutional: VS see above. General Appearance: alert, well-developed, well-nourished, NAD  Neck: No masses, trachea midline. No thyroid enlargement. No tenderness/mass appreciated. No lymphadenopathy  Respiratory: Normal respiratory effort. no wheeze, no rhonchi, no rales  Cardiovascular: S1/S2 normal, no murmur, no rub/gallop auscultated. RRR. No lower extremity edema  Musculoskeletal: Gait normal. No clubbing/cyanosis of digits.   Neurological: Normal balance/coordination. No tremor.   Skin: warm, dry, intact.     No results found for this or any previous visit (from the past 72 hour(s)).  No results found.   ASSESSMENT/PLAN:   Travel advice encounter - Plan: atovaquone-proguanil (MALARONE) 250-100 MG TABS tablet, typhoid (VIVOTIF) DR capsule, Measles/Mumps/Rubella Immunity, Hepatitis B surface antibody,qualitative, Hepatitis B surface antigen, Hepatitis B core antibody, total  Cough    Patient Instructions   The following prescriptions were sent to your pharmacy: Drugs to prevent malaria - start 2 days before you leave Pills for vaccine for typhoid - start now    Will [facility] regarding vaccines  Will test blood today for immunity to Measles and Hepatitis B and update these vaccines if needed  Please call the pulmonologist with your questions about the cough medicine.          If you have lab work done today you will Shayden contacted with your lab results within the next 2 weeks.  If you have not heard from us then please contact us. The fastest way to get your results is to register for My Chart.   IF you received an x-ray today, you will receive an invoice from [facility]. Please contact [facility] at [phone] with questions or concerns regarding your invoice.   IF you received labwork today, you will receive an invoice from [facility]. Please contact [facility] at [phone] with questions or concerns regarding your invoice.  Our billing staff will not Katerine able to assist you with questions regarding bills from these companies.  You will Verble contacted with the lab results as soon as they are available. The fastest way to get your results is to activate your My Chart account. Instructions are located on the last page of this paperwork. If you have not heard from [place] regarding the results in 2 weeks, please contact this office.         Visit summary with medication list and pertinent instructions was printed for patient to review. All questions at time of visit were answered - patient instructed to contact office with any additional concerns. ER/RTC precautions were reviewed with the patient.   Follow-up plan: Return for routine care as directed by your regular provider .  Note: Total time spent 15 minutes, greater than 50% of the visit was spent face-to-face counseling and coordinating care for the following: The primary encounter diagnosis was Travel advice encounter. A diagnosis of Cough was also pertinent to this visit.Marland Kitchen  Please note: voice recognition software was used to produce  this document, and typos may escape review. Please contact Dr. Lyn Hollingshead for any needed clarifications.

## 2018-08-25 LAB — HEPATITIS B SURFACE ANTIGEN: Hepatitis B Surface Ag: NEGATIVE

## 2018-08-25 LAB — MEASLES/MUMPS/RUBELLA IMMUNITY
MUMPS ABS, IGG: >300 AU/mL (ref >10.9)
RUBEOLA AB, IGG: 220 AU/mL (ref >16.4)
Rubella Antibodies, IGG: 14.4 index (ref >0.99)

## 2018-08-25 LAB — HEPATITIS B CORE ANTIBODY, TOTAL: HEP B C TOTAL AB: NEGATIVE

## 2018-08-25 LAB — HEPATITIS B SURFACE ANTIBODY,QUALITATIVE: Hep B Surface Ab, Qual: REACTIVE

## 2018-09-02 ENCOUNTER — Other Ambulatory Visit: Payer: Self-pay | Admitting: Family Medicine

## 2018-09-02 ENCOUNTER — Telehealth: Payer: Self-pay | Admitting: Pulmonary Disease

## 2018-09-02 DIAGNOSIS — E1165 Type 2 diabetes mellitus with hyperglycemia: Secondary | ICD-10-CM

## 2018-09-02 DIAGNOSIS — I10 Essential (primary) hypertension: Secondary | ICD-10-CM

## 2018-09-02 DIAGNOSIS — R05 Cough: Secondary | ICD-10-CM

## 2018-09-02 DIAGNOSIS — R059 Cough, unspecified: Secondary | ICD-10-CM

## 2018-09-02 MED ORDER — METOPROLOL TARTRATE 25 MG PO TABS: 12.5000 mg | ORAL_TABLET | Freq: Two times a day (BID) | ORAL | 3 refills | Status: DC

## 2018-09-02 MED ORDER — METFORMIN HCL 1000 MG PO TABS: 1000.0000 mg | ORAL_TABLET | Freq: Two times a day (BID) | ORAL | 3 refills | Status: DC

## 2018-09-02 MED ORDER — LORATADINE 10 MG PO TABS: 10.0000 mg | ORAL_TABLET | Freq: Every day | ORAL | 0 refills | Status: DC

## 2018-09-02 NOTE — Telephone Encounter (Signed)
Copied from CRM [phone]. Topic: Quick Communication - Rx Refill/Question >> Sep 02, 2018  9:01 AM Zada GirtLander, Lumin L wrote: Medication: metFORMIN (GLUCOPHAGE) 1000 MG tablet, metoprolol tartrate (LOPRESSOR) 25 MG tablet, omeprazole (PRILOSEC) 20 MG capsule, loratadine (CLARITIN) 10 MG tablet (patient leaving this Friday out of the country)  Has the patient contacted their pharmacy? Yes.   (Agent: If no, request that the patient contact the pharmacy for the refill.) (Agent: If yes, when and what did the pharmacy advise?)  Preferred Pharmacy (with phone number or street name): [id number] [address] - [phone] [facility] [address] [place] [address] Phone: [phone] Fax: [phone]  Agent: Please Enda advised that RX refills may take up to 3 business days. We ask that you follow-up with your pharmacy.

## 2018-09-02 NOTE — Telephone Encounter (Signed)
Attempted to call Patient's Daughter, Hedy Jacob.  Dr.Icard's Patient last seen by  EW, 08/12/18.  Left message to call back.

## 2018-09-03 NOTE — Telephone Encounter (Signed)
Spoke with daughter and notified of responses per [facility] and Dr Tonia Brooms  She verbalized understanding

## 2018-09-03 NOTE — Telephone Encounter (Signed)
I encouraged she taper off opiate cough medication during last office visit and only use sparingly. She can use robitussin or delsym over the counter. Unfortunately we will not Dorota providing further refills at this time.

## 2018-09-03 NOTE — Telephone Encounter (Signed)
Called and spoke with Patients Daughter.  Dr. Marjory Lies recommendations given.  She stated that her PCP told her it was pulmonary, and she would have to get it from pulmonary.  Last refill and OV was with EW, 08/12/18.  Ames Dura, NP, please advise if you will refill tussionex, since you had last OV

## 2018-09-03 NOTE — Telephone Encounter (Signed)
Called and spoke with Patients Daughter, Djin. She was last seen Darci Ames DuraElizabeth Walsh, NP, 08/12/18, for cough. She stated that they are leaving Friday for a month long trip to [place].  She is concerned that her Mother will have coughing spell, and will have no cough medication. Last refill for Tussionex, 1 bottle was 08/12/18.  [facility] [address] [place].  Will route to Dr. Tonia BroomsIcard

## 2018-09-03 NOTE — Telephone Encounter (Signed)
PCCM:  I have not been prescribing opiate cough medications to this patient. She did have a refill from [facility]. All of the prior have been coming from her PCP.   After check of the PDMP she has received 600mL of Tussinex from various providers. Non-opiate cough suppressants would Madeline Bell preferred.   I will not Madeline Bell refilling this at this time.   Madeline IgoBradley L Gemini Bunte, DO [facility] 09/03/2018 1:07 PM

## 2018-09-06 ENCOUNTER — Other Ambulatory Visit: Payer: Self-pay | Admitting: Family Medicine

## 2018-09-07 ENCOUNTER — Other Ambulatory Visit: Payer: Self-pay | Admitting: Family Medicine

## 2018-09-09 NOTE — Telephone Encounter (Signed)
Refill for omeprazole 20 mg tab. Refill not appropriate, ordered by provider not at PCP.  Refill already ordered 07/2018. Pt of Dr.Stallings  Review

## 2018-11-01 ENCOUNTER — Other Ambulatory Visit: Payer: Self-pay | Admitting: Family Medicine

## 2018-11-01 NOTE — Telephone Encounter (Signed)
Requested Prescriptions  Pending Prescriptions Disp Refills  . Continuous Blood Gluc Sensor (FREESTYLE LIBRE 14 DAY SENSOR) MISC [Pharmacy Med Name: FREESTYLE LIBRE 14 DAY SENSOR] 2 each 6    Sig: USE AS DIRECTED EVERY 14 DAYS     Endocrinology: Diabetes - Testing Supplies Passed - 11/01/2018  9:29 AM      Passed - Valid encounter within last 12 months    Recent Outpatient Visits          2 months ago Travel advice encounter   Primary Care at [place], [place], DO   4 months ago Cough syncope   Primary Care at [facility], Zoe A, MD   6 months ago Type 2 diabetes mellitus with hyperglycemia, without long-term current use of insulin [facility])   Primary Care at [facility], Manus Rudd, MD   7 months ago Essential hypertension   Primary Care at [facility], Asencion Partridge, MD   8 months ago Essential hypertension   Primary Care at [facility], Manus Rudd, MD

## 2018-11-24 IMAGING — RF DG ESOPHAGUS
6 series · 21 of 24 positions shown · non-contrast
Comparison: None.

CLINICAL DATA: Cough and dysphagia.  Concern for reflux

EXAM:
ESOPHOGRAM/BARIUM SWALLOW
TECHNIQUE: Single contrast examination was performed using  thin barium.
FLUOROSCOPY TIME:  Fluoroscopy Time:  0.9 minutes
Radiation Exposure Index (if provided by the fluoroscopic device): 7
mGy
Number of Acquired Spot Images: 0

[Series 2: cp_standard · 0.52mm/px · 3 of 80 frames shown (1 of 6)]
[frame 13/80]
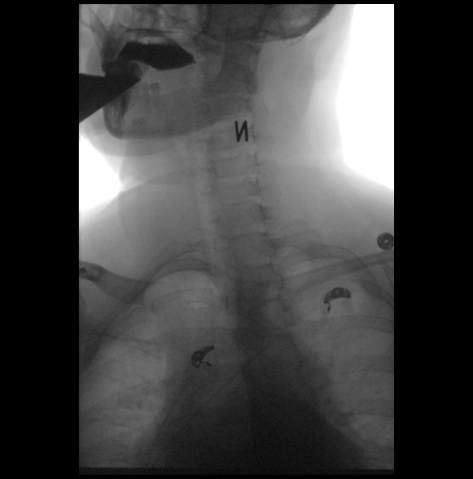
[frame 24/80]
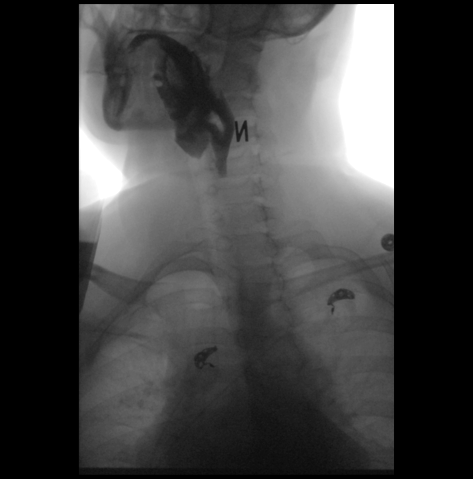
[frame 41/80]
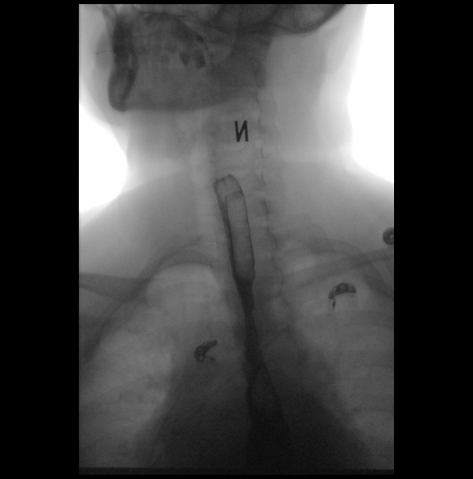

[Series 3: cp_standard · 0.52mm/px · 4 of 21 frames shown (2 of 6)]
[frame 1/21]
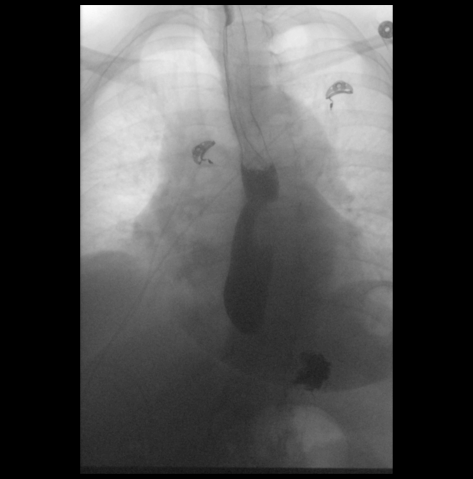
[frame 4/21]
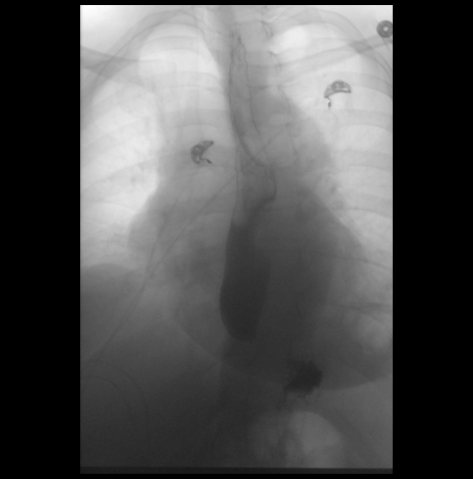
[frame 11/21]
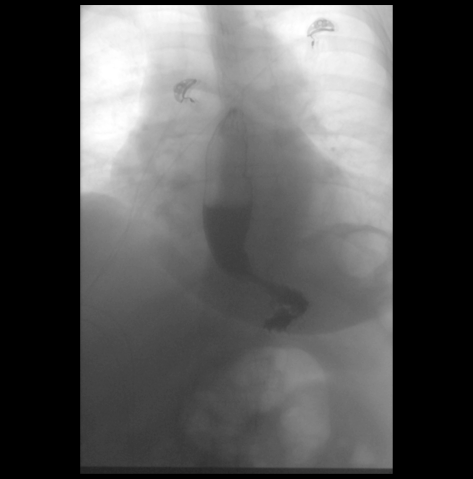
[frame 18/21]
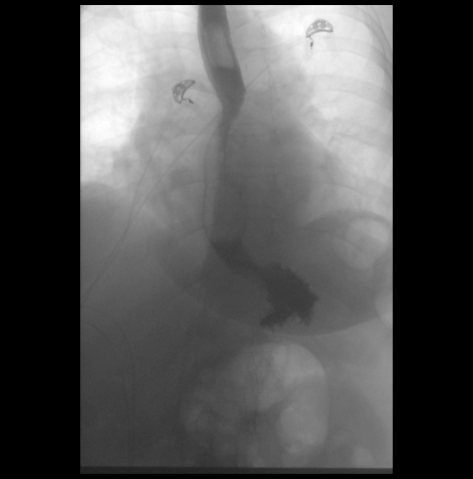

[Series 4: cp_standard · 0.52mm/px · 3 of 21 frames shown (3 of 6)]
[frame 1/21]
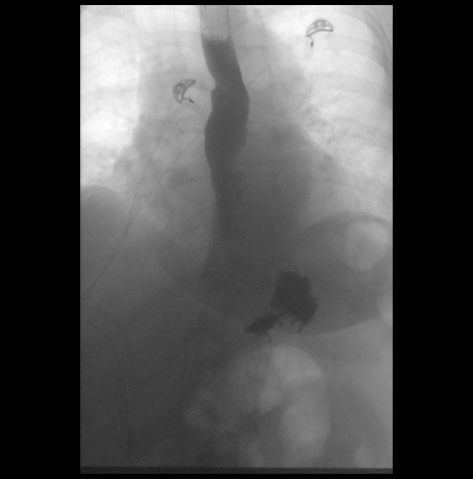
[frame 4/21]
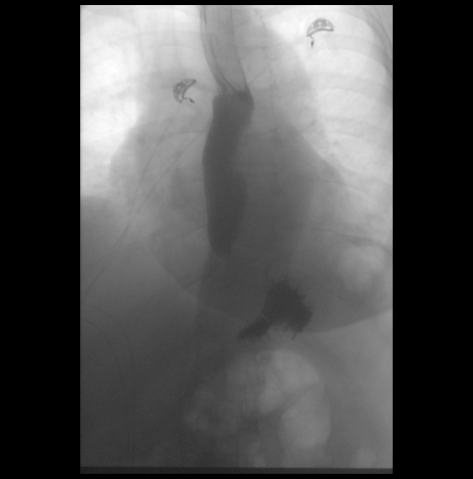
[frame 11/21]
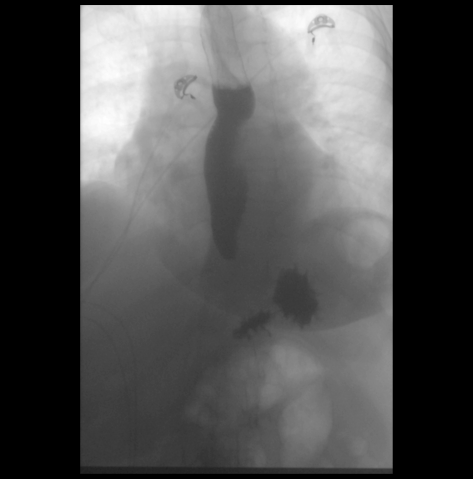

[Series 5: cp_standard · 0.35mm/px · 4 of 12 frames shown (4 of 6)]
[frame 2/12]
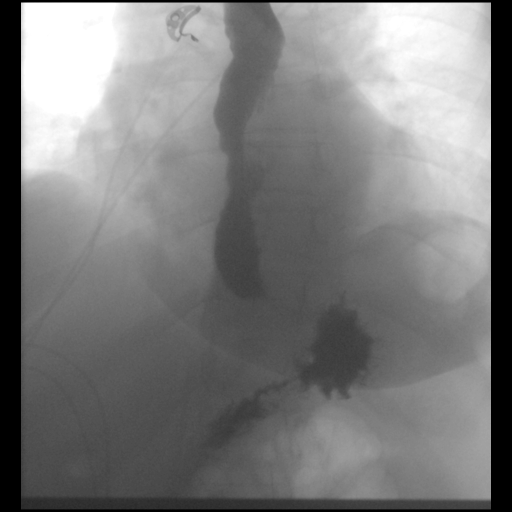
[frame 7/12]
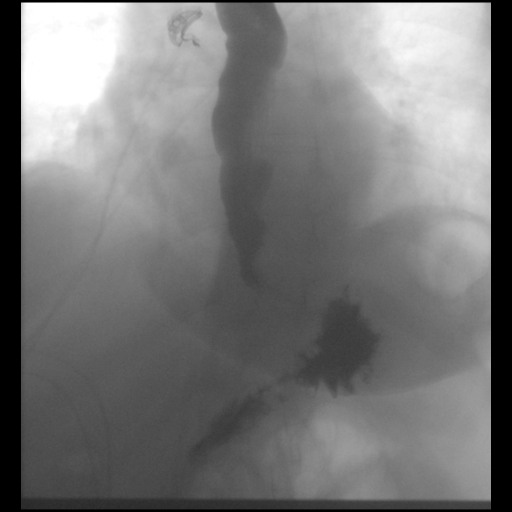
[frame 11/12]
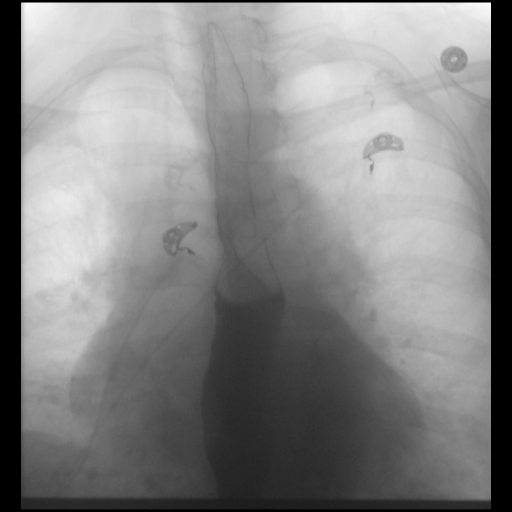
[frame 12/12]
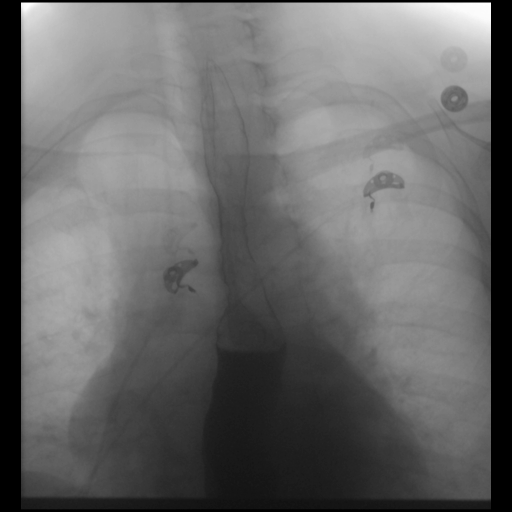

[Series 6: cp_standard · 0.35mm/px · 3 of 14 frames shown (5 of 6)]
[frame 3/14]
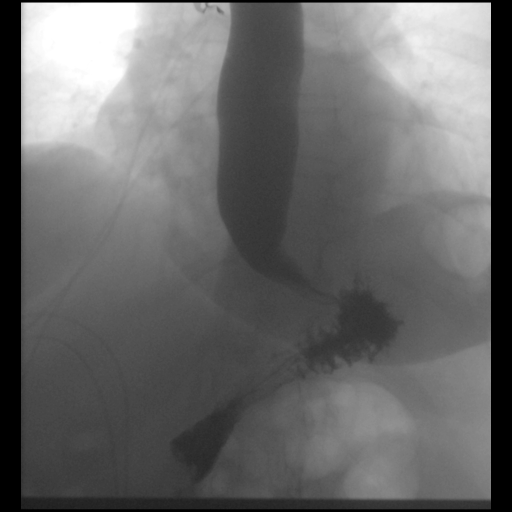
[frame 8/14]
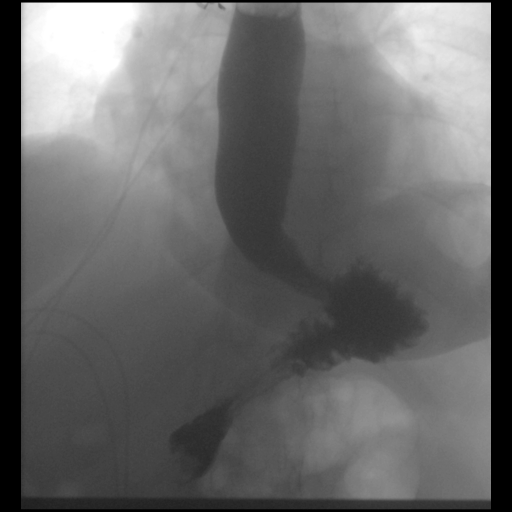
[frame 11/14]
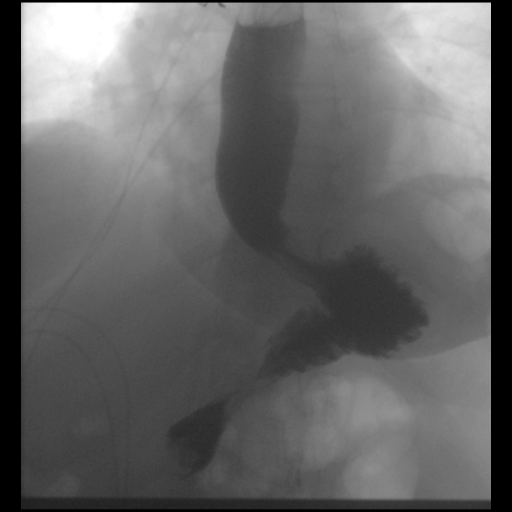

[Series 7: cp_standard · 0.35mm/px · 4 of 16 frames shown (6 of 6)]
[frame 3/16]
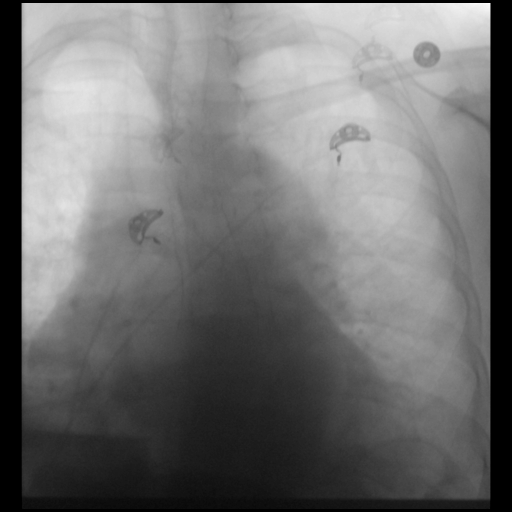
[frame 8/16]
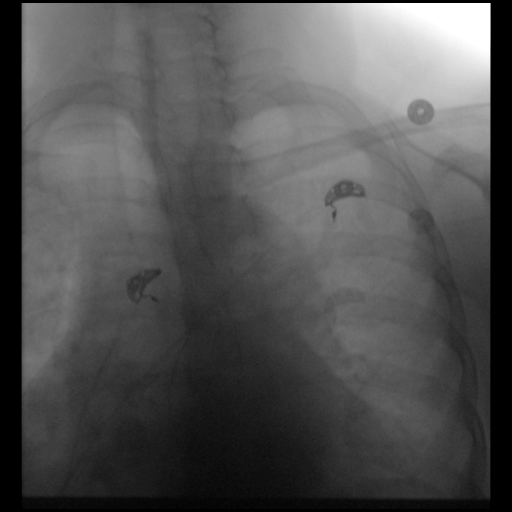
[frame 9/16]
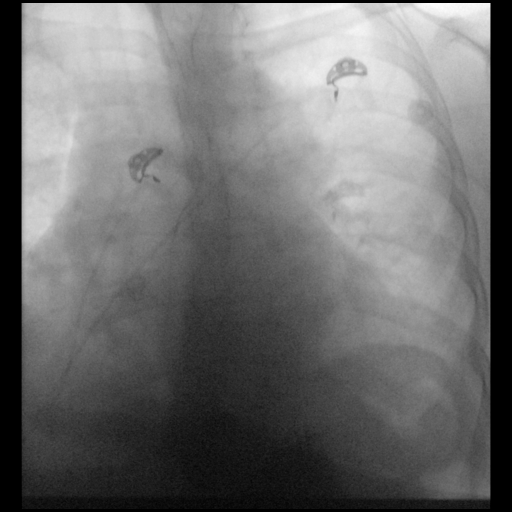
[frame 14/16]
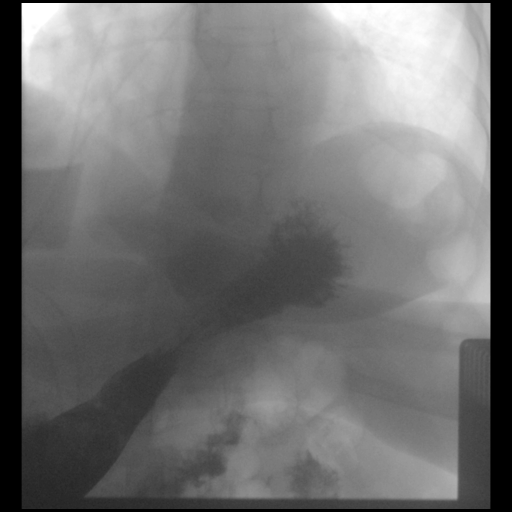

[21 of 24 positions shown; findings below may reference images not displayed]

FINDINGS: The patient is undergoing telemetry and running fluids. Single
contrast study was performed when standing.. Patient's daughter was
available for interpretation.

Oblique pharyngeal imaging shows no aspiration or obstructive
process. No diverticulum.

The esophagus has smooth normal mucosal contour. No stricture or
mass is seen. A 13 mm barium tablet easily traversed the esophagus.
No specific esophageal dysmotility but there was some stasis of
contrast associated with tertiary contractions.
IMPRESSION: 1. Negative for stricture or other obstructive process.
2. Nonspecific esophageal dysmotility.
3. Hiatal hernia noted on recent EGD is not visualized when
standing.

## 2018-12-13 ENCOUNTER — Other Ambulatory Visit: Payer: Self-pay | Admitting: Family Medicine

## 2018-12-13 DIAGNOSIS — R059 Cough, unspecified: Secondary | ICD-10-CM

## 2018-12-13 DIAGNOSIS — R05 Cough: Secondary | ICD-10-CM

## 2018-12-13 NOTE — Telephone Encounter (Signed)
Requested Prescriptions  Pending Prescriptions Disp Refills  . loratadine (CLARITIN) 10 MG tablet [Pharmacy Med Name: LORATADINE 10 MG TABLET] 90 tablet 0    Sig: TAKE 1 TABLET BY MOUTH EVERY DAY     Ear, Nose, and Throat:  Antihistamines Passed - 12/13/2018 11:58 AM      Passed - Valid encounter within last 12 months    Recent Outpatient Visits          3 months ago Travel advice encounter   Primary Care at [place], [place], DO   5 months ago Cough syncope   [facility] Care at [facility], Zoe A, MD   8 months ago Type 2 diabetes mellitus with hyperglycemia, without long-term current use of insulin [facility])   Primary Care at [facility], Manus Rudd, MD   9 months ago Essential hypertension   Primary Care at [facility], Asencion Partridge, MD   9 months ago Essential hypertension   Primary Care at [facility], Manus Rudd, MD

## 2019-02-16 ENCOUNTER — Other Ambulatory Visit: Payer: Self-pay | Admitting: Family Medicine

## 2019-02-16 DIAGNOSIS — I10 Essential (primary) hypertension: Secondary | ICD-10-CM

## 2019-02-16 NOTE — Telephone Encounter (Signed)
Requested Prescriptions  Pending Prescriptions Disp Refills  . metoprolol tartrate (LOPRESSOR) 25 MG tablet [Pharmacy Med Name: METOPROLOL TARTRATE 25 MG TAB] 30 tablet 0    Sig: TAKE 1/2 TABLET BY MOUTH TWICE A DAY     Cardiovascular:  Beta Blockers Passed - 02/16/2019  9:34 AM      Passed - Last BP in normal range    BP Readings from Last 1 Encounters:  08/24/18 137/85         Passed - Last Heart Rate in normal range    Pulse Readings from Last 1 Encounters:  08/24/18 70         Passed - Valid encounter within last 6 months    Recent Outpatient Visits          5 months ago Travel advice encounter   Primary Care at [place], [place], DO   7 months ago Cough syncope   Primary Care at [facility], [place], MD   10 months ago Type 2 diabetes mellitus with hyperglycemia, without long-term current use of insulin [facility])   Primary Care at [place], MD   11 months ago Essential hypertension   [facility] at [facility], Ranell Patrick, MD   11 months ago Essential hypertension   Primary Care at [facility], Arlie Solomons, MD

## 2019-03-13 ENCOUNTER — Other Ambulatory Visit: Payer: Self-pay | Admitting: Family Medicine

## 2019-03-13 DIAGNOSIS — E1165 Type 2 diabetes mellitus with hyperglycemia: Secondary | ICD-10-CM

## 2019-03-13 DIAGNOSIS — I10 Essential (primary) hypertension: Secondary | ICD-10-CM

## 2019-03-13 NOTE — Telephone Encounter (Signed)
Requested medication (s) are due for refill today:   Yes  Requested medication (s) are on the active medication list:   Yes  Future visit scheduled:   Yes with [place] 03/19/2019   Last ordered: 02/16/2019  #30  0 refills.    Already given 30 day courtesy supply   Requested Prescriptions  Pending Prescriptions Disp Refills   metoprolol tartrate (LOPRESSOR) 25 MG tablet [Pharmacy Med Name: METOPROLOL TARTRATE 25 MG TAB] 30 tablet 0    Sig: TAKE 1/2 TABLET BY MOUTH TWICE A DAY     Cardiovascular:  Beta Blockers Failed - 03/13/2019  6:23 AM      Failed - Valid encounter within last 6 months    Recent Outpatient Visits          6 months ago Travel advice encounter   Primary Care at [place], [place], DO   8 months ago Cough syncope   Primary Care at [facility], [place] A, MD   11 months ago Type 2 diabetes mellitus with hyperglycemia, without long-term current use of insulin ([place])   Primary Care at [facility], Arlie Solomons, MD   1 year ago Essential hypertension   Primary Care at [facility], Ranell Patrick, MD   1 year ago Essential hypertension   Primary Care at [place], MD      Future Appointments            In 6 days Forrest Moron, MD Primary Care at [place], [place] BP in normal range    BP Readings from Last 1 Encounters:  08/24/18 137/85         Passed - Last Heart Rate in normal range    Pulse Readings from Last 1 Encounters:  08/24/18 70          metFORMIN (GLUCOPHAGE) 1000 MG tablet [Pharmacy Med Name: METFORMIN HCL 1,000 MG TABLET] 60 tablet 3    Sig: TAKE 1 TABLET BY MOUTH TWICE A DAY WITH MEAL     Endocrinology:  Diabetes - Biguanides Failed - 03/13/2019  6:23 AM      Failed - HBA1C is between 0 and 7.9 and within 180 days    Hgb A1c MFr Bld  Date Value Ref Range Status  06/06/2018 7.6 (H) 4.8 - 5.6 % Final    Comment:    (NOTE) Pre diabetes:          5.7%-6.4% Diabetes:              >6.4% Glycemic control  for   <7.0% adults with diabetes          Failed - Valid encounter within last 6 months    Recent Outpatient Visits          6 months ago Travel advice encounter   Primary Care at [place], [place], DO   8 months ago Cough syncope   Primary Care at [facility], Zoe A, MD   11 months ago Type 2 diabetes mellitus with hyperglycemia, without long-term current use of insulin [facility])   Primary Care at [facility], Arlie Solomons, MD   1 year ago Essential hypertension   [facility] at [facility], Ranell Patrick, MD   1 year ago Essential hypertension   Primary Care at [place], MD      Future Appointments            In 6  days Forrest Moron, MD Primary Care at [place], [place] in normal range and within 360 days    Creat  Date Value Ref Range Status  03/27/2014 0.53 0.50 - 1.10 mg/dL Final   Creatinine, Ser  Date Value Ref Range Status  06/06/2018 0.71 0.44 - 1.00 mg/dL Final         Passed - eGFR in normal range and within 360 days    GFR, Est African American  Date Value Ref Range Status  03/27/2014 >89 mL/min Final   GFR calc Af Amer  Date Value Ref Range Status  06/06/2018 >60 >60 mL/min Final    Comment:    (NOTE) The eGFR has been calculated using the CKD EPI equation. This calculation has not been validated in all clinical situations. eGFR's persistently <60 mL/min signify possible Chronic Kidney Disease.    GFR, Est Non African American  Date Value Ref Range Status  03/27/2014 >89 mL/min Final    Comment:      The estimated GFR is a calculation valid for adults (>=39 years old) that uses the CKD-EPI algorithm to adjust for age and sex. It is   not to [place] used for children, pregnant women, hospitalized patients,    patients on dialysis, or with rapidly changing kidney function. According to the NKDEP, eGFR >89 is normal, 60-89 shows mild impairment, 30-59 shows moderate impairment, 15-29 shows severe impairment  and <15 is ESRD.     GFR calc non Af Amer  Date Value Ref Range Status  06/06/2018 >60 >60 mL/min Final

## 2019-03-19 ENCOUNTER — Other Ambulatory Visit: Payer: Self-pay

## 2019-03-19 ENCOUNTER — Encounter: Payer: Self-pay | Admitting: Family Medicine

## 2019-03-19 ENCOUNTER — Ambulatory Visit (INDEPENDENT_AMBULATORY_CARE_PROVIDER_SITE_OTHER): Payer: Self-pay | Admitting: Family Medicine

## 2019-03-19 VITALS — BP 116/74 | HR 95 | Temp 98.8°F | Resp 17 | Ht 62.0 in | Wt 157.8 lb

## 2019-03-19 DIAGNOSIS — E119 Type 2 diabetes mellitus without complications: Secondary | ICD-10-CM

## 2019-03-19 DIAGNOSIS — E782 Mixed hyperlipidemia: Secondary | ICD-10-CM

## 2019-03-19 DIAGNOSIS — I10 Essential (primary) hypertension: Secondary | ICD-10-CM

## 2019-03-19 DIAGNOSIS — E1165 Type 2 diabetes mellitus with hyperglycemia: Secondary | ICD-10-CM

## 2019-03-19 LAB — POCT GLYCOSYLATED HEMOGLOBIN (HGB A1C): Hemoglobin A1C: 6.6 % — AB (ref 4.0–5.6)

## 2019-03-19 MED ORDER — SIMVASTATIN 20 MG PO TABS: 20.0000 mg | ORAL_TABLET | Freq: Every day | ORAL | 3 refills | Status: DC

## 2019-03-19 MED ORDER — METFORMIN HCL 1000 MG PO TABS: ORAL_TABLET | ORAL | 6 refills | Status: DC

## 2019-03-19 MED ORDER — OMEPRAZOLE 20 MG PO CPDR: 20.0000 mg | DELAYED_RELEASE_CAPSULE | Freq: Two times a day (BID) | ORAL | 5 refills | Status: DC

## 2019-03-19 MED ORDER — METOPROLOL TARTRATE 25 MG PO TABS: 12.5000 mg | ORAL_TABLET | Freq: Two times a day (BID) | ORAL | 11 refills | Status: DC

## 2019-03-19 NOTE — Progress Notes (Addendum)
Established Patient Office Visit  Subjective:  Patient ID: Madeline Bell, female    DOB: 04/24/1956  Age: 63 y.o. MRN: [id number]  CC:  Chief Complaint  Patient presents with  . Diabetes    f/u    HPI Madeline Bell presents for   Diabetes Mellitus: Patient presents for follow up of diabetes. Symptoms: none. Symptoms have stabilized. Patient denies foot ulcerations, hyperglycemia, hypoglycemia , increase appetite and nausea.  Evaluation to date has been included: hemoglobin A1C.  Home sugars: patient does not check sugars.  Lab Results  Component Value Date   HGBA1C 6.6 (A) 03/19/2019    The 10-year ASCVD risk score Denman George(Goff DC Jr., et al., 2013) is: 14%   Values used to calculate the score:     Age: [age] years     Sex: Female     Is Non-Hispanic African American: No     Diabetic: Yes     Tobacco smoker: No     Systolic Blood Pressure: 116 mmHg     Is BP treated: Yes     HDL Cholesterol: 35 mg/dL     Total Cholesterol: 266 mg/dL Hypertension: Patient here for follow-up of elevated blood pressure. She is exercising and is adherent to low salt diet.  Blood pressure is well controlled at home. Cardiac symptoms none. Patient denies chest pain, chest pressure/discomfort, claudication, exertional chest pressure/discomfort, irregular heart beat and lower extremity edema.  Cardiovascular risk factors: diabetes mellitus and dyslipidemia. Use of agents associated with hypertension: none. History of target organ damage: none. BP Readings from Last 3 Encounters:  03/19/19 116/74  08/24/18 137/85  08/22/18 (!) 146/82     Past Medical History:  Diagnosis Date  . CHF (congestive heart failure) (HCC)   . Diabetes mellitus without complication (HCC)   . Dysphagia    esophageal dysphagia  . GERD (gastroesophageal reflux disease)   . Heart murmur   . Hiatal hernia 03/26/2018   EGD 03/26/18 shows gastritis and hiatal hernia 4cm  . Hypertension   . Sickle cell anemia (HCC)    trait    Past Surgical  History:  Procedure Laterality Date  . UPPER GASTROINTESTINAL ENDOSCOPY  03/26/2018    Family History  Problem Relation Age of Onset  . Hypertension Father   . Diabetes Sister   . Diabetes Brother   . Colon cancer Neg Hx   . Esophageal cancer Neg Hx   . Rectal cancer Neg Hx   . Stomach cancer Neg Hx     Social History   Socioeconomic History  . Marital status: Married    Spouse name: Not on file  . Number of children: Not on file  . Years of education: Not on file  . Highest education level: Not on file  Occupational History  . Not on file  Social Needs  . Financial resource strain: Not on file  . Food insecurity    Worry: Not on file    Inability: Not on file  . Transportation needs    Medical: Not on file    Non-medical: Not on file  Tobacco Use  . Smoking status: Never Smoker  . Smokeless tobacco: Never Used  Substance and Sexual Activity  . Alcohol use: Not Currently  . Drug use: Never  . Sexual activity: Not on file  Lifestyle  . Physical activity    Days per week: Not on file    Minutes per session: Not on file  . Stress: Not on file  Relationships  .  Social Musicianconnections    Talks on phone: Not on file    Gets together: Not on file    Attends religious service: Not on file    Active member of club or organization: Not on file    Attends meetings of clubs or organizations: Not on file    Relationship status: Not on file  . Intimate partner violence    Fear of current or ex partner: Not on file    Emotionally abused: Not on file    Physically abused: Not on file    Forced sexual activity: Not on file  Other Topics Concern  . Not on file  Social History Narrative  . Not on file    Outpatient Medications Prior to Visit  Medication Sig Dispense Refill  . Continuous Blood Gluc Sensor (FREESTYLE LIBRE 14 DAY SENSOR) MISC USE AS DIRECTED EVERY 14 DAYS 2 each 6  . losartan (COZAAR) 50 MG tablet Take 1 tablet (50 mg total) by mouth daily. 30 tablet 11  .  metFORMIN (GLUCOPHAGE) 1000 MG tablet TAKE 1 TABLET BY MOUTH TWICE A DAY WITH MEAL 60 tablet 3  . metoprolol tartrate (LOPRESSOR) 25 MG tablet TAKE 1/2 TABLET BY MOUTH TWICE A DAY 30 tablet 0  . omeprazole (PRILOSEC) 20 MG capsule Take 1 capsule (20 mg total) by mouth 2 (two) times daily. 60 capsule 5  . Ascorbic Acid (VITAMIN C PO) Take 1 tablet by mouth daily.     Marland Kitchen. atovaquone-proguanil (MALARONE) 250-100 MG TABS tablet Take 1 tablet by mouth daily. One tab PO daily starting 2 days before travel and finish 7 days after return home. (Patient not taking: Reported on 03/19/2019) 45 tablet 0  . bisacodyl (DULCOLAX) 5 MG EC tablet Take 5 mg by mouth once.    . chlorpheniramine-HYDROcodone (TUSSIONEX) 10-8 MG/5ML SUER Take 5 mLs by mouth every 12 (twelve) hours as needed for cough (if guaifenesin not effective). Or if Delsym is not effective. (Patient not taking: Reported on 03/19/2019) 1 Bottle 0  . Continuous Blood Gluc Receiver (FREESTYLE LIBRE 14 DAY READER) DEVI 1 Device by Does not apply route QID. Use to check blood glucose 3 times a day (Patient not taking: Reported on 03/19/2019) 1 Device 0  . glucose blood test strip Use as instructed (Patient not taking: Reported on 03/19/2019) 100 each 12  . loratadine (CLARITIN) 10 MG tablet TAKE 1 TABLET BY MOUTH EVERY DAY (Patient not taking: Reported on 03/19/2019) 90 tablet 0  . typhoid (VIVOTIF) DR capsule Take 1 capsule by mouth every other day. (Patient not taking: Reported on 03/19/2019) 4 capsule 0   No facility-administered medications prior to visit.     Allergies  Allergen Reactions  . Tylenol [Acetaminophen] Rash  . Amlodipine Besylate Rash  . Lisinopril-Hydrochlorothiazide   . Bactrim [Sulfamethoxazole-Trimethoprim]     Rash   . Cephalexin   . Doxycycline     Rash and itching and lips feel burnt, no anaphylaxis  . Gentamicin Sulfate   . Shellfish Allergy   . Nsaids Rash  . Penicillins Rash and Other (See Comments)    Burning   Has patient  had a PCN reaction causing immediate rash, facial/tongue/throat swelling, SOB or lightheadedness with hypotension: Yes Has patient had a PCN reaction causing severe rash involving mucus membranes or skin necrosis: No Has patient had a PCN reaction that required hospitalization: No Has patient had a PCN reaction occurring within the last 10 years: No If all of the above answers are "NO",  then may proceed with Cephalosporin use.   . Tetracyclines & Related Rash    ROS Review of Systems  Review of Systems  Constitutional: Negative for activity change, appetite change, chills and fever.  HENT: Negative for congestion, nosebleeds, trouble swallowing and voice change.   Respiratory: Negative for cough, shortness of breath and wheezing.   Gastrointestinal: Negative for diarrhea, nausea and vomiting.  Genitourinary: Negative for difficulty urinating, dysuria, flank pain and hematuria.  Musculoskeletal: Negative for back pain, joint swelling and neck pain.  Neurological: Negative for dizziness, speech difficulty, light-headedness and numbness.  See HPI. All other review of systems negative.     Objective:    Physical Exam  BP 116/74 (BP Location: Left Arm, Patient Position: Sitting, Cuff Size: Normal)   Pulse 95   Temp 98.8 F (37.1 C) (Oral)   Resp 17   Ht 5\' 2"  (1.575 m)   Wt 157 lb 12.8 oz (71.6 kg)   SpO2 95%   BMI 28.86 kg/m  Wt Readings from Last 3 Encounters:  03/19/19 157 lb 12.8 oz (71.6 kg)  08/24/18 163 lb (73.9 kg)  08/22/18 163 lb (73.9 kg)   Physical Exam  Constitutional: Oriented to person, place, and time. Appears well-developed and well-nourished.  HENT:  Head: Normocephalic and atraumatic.  Eyes: Conjunctivae and EOM are normal.  Cardiovascular: Normal rate, regular rhythm, normal heart sounds and intact distal pulses.  No murmur heard. Pulmonary/Chest: Effort normal and breath sounds normal. No stridor. No respiratory distress. Has no wheezes.  Neurological:  Is alert and oriented to person, place, and time.  Skin: Skin is warm. Capillary refill takes less than 2 seconds.  Psychiatric: Has a normal mood and affect. Behavior is normal. Judgment and thought content normal.    Health Maintenance Due  Topic Date Due  . Hepatitis C Screening  09[birth year]  . PAP SMEAR-Modifier  08/14/1976  . MAMMOGRAM  08/14/2005  . COLONOSCOPY  08/14/2005  . INFLUENZA VACCINE  03/15/2019    There are no preventive care reminders to display for this patient.  Lab Results  Component Value Date   TSH 2.189 06/04/2018   Lab Results  Component Value Date   WBC 9.2 06/06/2018   HGB 10.1 (L) 06/06/2018   HCT 32.5 (L) 06/06/2018   MCV 84.0 06/06/2018   PLT 354 06/06/2018   Lab Results  Component Value Date   NA 141 06/06/2018   K 4.1 06/06/2018   CO2 27 06/06/2018   GLUCOSE 142 (H) 06/06/2018   BUN 11 06/06/2018   CREATININE 0.71 06/06/2018   BILITOT 0.4 06/06/2018   ALKPHOS 66 06/06/2018   AST 34 06/06/2018   ALT 34 06/06/2018   PROT 7.2 06/06/2018   ALBUMIN 3.6 06/06/2018   CALCIUM 8.7 (L) 06/06/2018   ANIONGAP 6 06/06/2018   Lab Results  Component Value Date   CHOL 266 (H) 02/20/2018   Lab Results  Component Value Date   HDL 35 (L) 02/20/2018   Lab Results  Component Value Date   LDLCALC 166 (H) 02/20/2018   Lab Results  Component Value Date   TRIG 325 (H) 02/20/2018   Lab Results  Component Value Date   CHOLHDL 7.6 (H) 02/20/2018   Lab Results  Component Value Date   HGBA1C 6.6 (A) 03/19/2019      Assessment & Plan:   Problem List Items Addressed This Visit    None    Visit Diagnoses    Type 2 diabetes mellitus without complication, without long-term  current use of insulin ([place])    -  Primary well controlled hemoglobin a1c is at goal Continue exercise Lipids monitored and renal function in range On metformin On losartan On asa 81mg  Reviewed diabetic foot care Emphasized importance of eye and dental exam       Relevant Medications   simvastatin (ZOCOR) 20 MG tablet   Other Relevant Orders   Lipid panel   POCT glycosylated hemoglobin (Hb A1C) (Completed)   Comprehensive metabolic panel   Essential hypertension    - Patient's blood pressure is at goal of 139/89 or less. Condition is stable. Continue current medications and treatment plan. I recommend that you exercise for 30-45 minutes 5 days a week. I also recommend a balanced diet with fruits and vegetables every day, lean meats, and little fried foods. The DASH diet (you can find this online) is a good example of this.    Relevant Medications   simvastatin (ZOCOR) 20 MG tablet   Other Relevant Orders   Lipid panel   Comprehensive metabolic panel   Mixed hyperlipidemia       Relevant Medications   simvastatin (ZOCOR) 20 MG tablet      Meds ordered this encounter  Medications  . simvastatin (ZOCOR) 20 MG tablet    Sig: Take 1 tablet (20 mg total) by mouth at bedtime.    Dispense:  90 tablet    Refill:  3    Follow-up: No follow-ups on file.    Forrest Moron, MD

## 2019-03-19 NOTE — Patient Instructions (Addendum)
We recommend that you schedule a mammogram for breast cancer screening. Typically, you do not need a referral to do this. Please contact a local imaging center to schedule your mammogram.  [facility] - [phone]  *ask for the Radiology Department [facility] - [phone] or [phone]  [facility] - [phone] [facility] - [phone] [facility] - [phone]  *ask for the Radiology Department [facility] - [phone]  *ask for the Radiology Department [facility] - [phone]  *ask for the Mammography Department [facility] - [phone]    If you have lab work done today you will Madeline Bell contacted with your lab results within the next 2 weeks.  If you have not heard from us then please contact us. The fastest way to get your results is to register for My Chart.   IF you received an x-ray today, you will receive an invoice from [facility]. Please contact [facility] at [phone] with questions or concerns regarding your invoice.   IF you received labwork today, you will receive an invoice from [facility]. Please contact [facility] at [phone] with questions or concerns regarding your invoice.   Our billing staff will not Madeline Bell able to assist you with questions regarding bills from these companies.  You will Madeline Bell contacted with the lab results as soon as they are available. The fastest way to get your results is to activate your My Chart account. Instructions are located on the last page of this paperwork. If you have not heard from us regarding the results in 2 weeks, please contact this office.     

## 2019-03-20 LAB — COMPREHENSIVE METABOLIC PANEL
ALT: 24 IU/L (ref 0–32)
AST: 20 IU/L (ref 0–40)
Albumin/Globulin Ratio: 1.7 (ref 1.2–2.2)
Albumin: 4.7 g/dL (ref 3.8–4.8)
Alkaline Phosphatase: 91 IU/L (ref 39–117)
BUN/Creatinine Ratio: 14 (ref 12–28)
BUN: 13 mg/dL (ref 8–27)
Bilirubin Total: <0.2 mg/dL (ref 0.0–1.2)
CO2: 22 mmol/L (ref 20–29)
Calcium: 9.9 mg/dL (ref 8.7–10.3)
Chloride: 102 mmol/L (ref 96–106)
Creatinine, Ser: 0.9 mg/dL (ref 0.57–1.00)
GFR calc Af Amer: 79 mL/min/{1.73_m2} (ref >59)
GFR calc non Af Amer: 68 mL/min/{1.73_m2} (ref >59)
Globulin, Total: 2.8 g/dL (ref 1.5–4.5)
Glucose: 124 mg/dL — ABNORMAL HIGH (ref 65–99)
Potassium: 4.7 mmol/L (ref 3.5–5.2)
Sodium: 142 mmol/L (ref 134–144)
Total Protein: 7.5 g/dL (ref 6.0–8.5)

## 2019-03-20 LAB — LIPID PANEL
Chol/HDL Ratio: 6.8 ratio — ABNORMAL HIGH (ref 0.0–4.4)
Cholesterol, Total: 230 mg/dL — ABNORMAL HIGH (ref 100–199)
HDL: 34 mg/dL — ABNORMAL LOW (ref >39)
Triglycerides: 515 mg/dL — ABNORMAL HIGH (ref 0–149)

## 2019-03-23 ENCOUNTER — Other Ambulatory Visit: Payer: Self-pay | Admitting: Family Medicine

## 2019-03-23 DIAGNOSIS — R05 Cough: Secondary | ICD-10-CM

## 2019-03-23 DIAGNOSIS — R059 Cough, unspecified: Secondary | ICD-10-CM

## 2019-03-23 NOTE — Telephone Encounter (Signed)
Requested medication (s) are due for refill today: yes  Requested medication (s) are on the active medication list: no  Last refill:  12/13/18  Future visit scheduled: yes  Notes to clinic:  Pt request prescription. Previous prescription expired 03/19/19   Requested Prescriptions  Pending Prescriptions Disp Refills   loratadine (CLARITIN) 10 MG tablet [Pharmacy Med Name: LORATADINE 10 MG TABLET] 90 tablet 0    Sig: TAKE 1 TABLET BY MOUTH EVERY DAY     Ear, Nose, and Throat:  Antihistamines Passed - 03/23/2019  3:39 PM      Passed - Valid encounter within last 12 months    Recent Outpatient Visits          4 days ago Type 2 diabetes mellitus without complication, without long-term current use of insulin ([place])   Primary Care at [facility], Arlie Solomons, MD   7 months ago Travel advice encounter   Primary Care at [place], [place], DO   9 months ago Cough syncope   Primary Care at [facility], [place], MD   11 months ago Type 2 diabetes mellitus with hyperglycemia, without long-term current use of insulin [facility])   Primary Care at [facility], Arlie Solomons, MD   1 year ago Essential hypertension   Primary Care at [facility], Ranell Patrick, MD      Future Appointments            In 5 months Forrest Moron, MD Primary Care at [place], [facility]

## 2019-06-20 ENCOUNTER — Telehealth: Payer: Self-pay

## 2019-06-20 NOTE — Telephone Encounter (Signed)
Called pt to reschedule appointment as provider will not Madeline Bell in the office on 11/19. Unable to leave message.

## 2019-06-24 NOTE — Telephone Encounter (Signed)
Attempted to contact pt x 2 to reschedule appointment. Unable to leave message as mail box is full.

## 2019-06-25 NOTE — Telephone Encounter (Signed)
Attempted to contact pt x 3. Unable to leave message as mailbox is full.

## 2019-06-27 NOTE — Telephone Encounter (Signed)
Attempted to contact pt x 4 to reschedule appointment. Nurse also called daughter who but unable to leave a message for both.

## 2019-07-01 ENCOUNTER — Telehealth: Payer: Self-pay | Admitting: Cardiology

## 2019-07-01 NOTE — Telephone Encounter (Signed)
Unable to reach pt daughter or leave a message mailbox is full 

## 2019-07-01 NOTE — Telephone Encounter (Signed)
Patient's daughter called to confirm appt for tomorrow, I rescheduled her for 07/22/19 at 8:00am. Patient also states she would like the results from her mothers heart monitor. She says she never got the results from the monitor that was put on last year.

## 2019-07-01 NOTE — Telephone Encounter (Signed)
Returned call to daughter Nani Skillern no answer.Unable to leave a voice mail.Voice mail is full.

## 2019-07-03 ENCOUNTER — Ambulatory Visit: Payer: Self-pay | Admitting: Cardiology

## 2019-07-04 NOTE — Telephone Encounter (Signed)
Attempted to call pt's daughter mailbox full unable to leave message .Adonis Housekeeper

## 2019-07-04 NOTE — Telephone Encounter (Signed)
Called three times via triage attempts, per protocol will remove from triage list.

## 2019-07-07 ENCOUNTER — Other Ambulatory Visit: Payer: Self-pay | Admitting: Family Medicine

## 2019-07-07 DIAGNOSIS — R05 Cough: Secondary | ICD-10-CM

## 2019-07-07 DIAGNOSIS — R059 Cough, unspecified: Secondary | ICD-10-CM

## 2019-07-22 ENCOUNTER — Encounter: Payer: Self-pay | Admitting: Cardiology

## 2019-07-22 ENCOUNTER — Ambulatory Visit (INDEPENDENT_AMBULATORY_CARE_PROVIDER_SITE_OTHER): Payer: Self-pay | Admitting: Cardiology

## 2019-07-22 ENCOUNTER — Other Ambulatory Visit: Payer: Self-pay

## 2019-07-22 VITALS — BP 158/82 | HR 72 | Temp 97.0°F | Ht 61.0 in | Wt 159.2 lb

## 2019-07-22 DIAGNOSIS — I447 Left bundle-branch block, unspecified: Secondary | ICD-10-CM

## 2019-07-22 DIAGNOSIS — Z7189 Other specified counseling: Secondary | ICD-10-CM

## 2019-07-22 DIAGNOSIS — Z87898 Personal history of other specified conditions: Secondary | ICD-10-CM

## 2019-07-22 DIAGNOSIS — I471 Supraventricular tachycardia: Secondary | ICD-10-CM

## 2019-07-22 DIAGNOSIS — I10 Essential (primary) hypertension: Secondary | ICD-10-CM

## 2019-07-22 DIAGNOSIS — I5189 Other ill-defined heart diseases: Secondary | ICD-10-CM

## 2019-07-22 DIAGNOSIS — Z712 Person consulting for explanation of examination or test findings: Secondary | ICD-10-CM

## 2019-07-22 NOTE — Patient Instructions (Addendum)
Medication Instructions:  Your Physician recommend you continue on your current medication as directed.    If blood pressure is consistently more than 130/80, talk to your primary care doctor about restarting losartan or another medication for better blood pressure control.  *If you need a refill on your cardiac medications before your next appointment, please call your pharmacy*  Lab Work: None  Testing/Procedures: None  Follow-Up: At [facility], you and your health needs are our priority.  As part of our continuing mission to provide you with exceptional heart care, we have created designated Provider Care Teams.  These Care Teams include your primary Cardiologist (physician) and Advanced Practice Providers (APPs -  Physician Assistants and Nurse Practitioners) who all work together to provide you with the care you need, when you need it.  Your next appointment:   As needed  The format for your next appointment:   Either In Person or Virtual  Provider:   Buford Dresser, MD

## 2019-07-22 NOTE — Progress Notes (Signed)
Cardiology Office Note:    Date:  08/19/2019   ID:  Madeline Bell, DOB 02/27/1956, MRN [id number]  PCP:  Madeline Bell, Madeline A, MD  Cardiologist:  Madeline RedBridgette Sanaya Gwilliam, MD PhD  Referring MD: Madeline Bell, Madeline A, MD   CC: yearly follow up.  History of Present Illness:    Madeline Bell is Bell 63 y.o. female with Bell hx of hypertension, type II diabetes who is seen for follow up today. She was initially seen 07/03/2018 as Bell new consult at the request of Madeline Bell, Madeline A, MD for the evaluation and management of syncope. She was discharged from the hospital on 06/06/18 after Bell syncopal event following Bell coughing spell.   Daughter is present today and translating for the patient.   Today: Patient's husband just passed away 12/2018, which was traumatic for the family. I offered my condolences. She has not had any further syncope. No PND, orthopnea, LE edema, or shortness of breath to suggest any diastolic heart failure symptoms. No routine home weight measurements.   She was started on statin by PCP, didn't feel well on the medications. Reports that it made her feel hot and uncomfortable. She stopped the medication. Also reported itchiness and swelling on it. She prefers to remain off cholesterol medication for now (appears she was on simvastatin). Has not had any chest pain.  Blood pressure is elevated today. She checks only intermittently at home. No log with her, but notes her systolic number yesterday was [id number]. Currently only taking metoprolol BID. Has since stopped the losartan. She feels like she takes too much medication. We discussed goals for BP control, how to monitor, how to keep BP log.  We reviewed the results of her event monitor, which showed multiple brief episodes of SVT. She reports that these were asymptomatic. We discussed that the metoprolol will help with this. She does not want to have an ablation, and as long as these are asymptomatic she prefers to just continue with the metoprolol.  Past Medical  History:  Diagnosis Date  . CHF (congestive heart failure) (HCC)   . Diabetes mellitus without complication (HCC)   . Dysphagia    esophageal dysphagia  . GERD (gastroesophageal reflux disease)   . Heart murmur   . Hiatal hernia 03/26/2018   EGD 03/26/18 shows gastritis and hiatal hernia 4cm  . Hypertension   . Sickle cell anemia (HCC)    trait    Past Surgical History:  Procedure Laterality Date  . UPPER GASTROINTESTINAL ENDOSCOPY  03/26/2018    Current Medications: Current Outpatient Medications on File Prior to Visit  Medication Sig  . loratadine (CLARITIN) 10 MG tablet TAKE 1 TABLET BY MOUTH EVERY DAY  . metFORMIN (GLUCOPHAGE) 1000 MG tablet TAKE 1 TABLET BY MOUTH TWICE Bell DAY WITH MEAL  . metoprolol tartrate (LOPRESSOR) 25 MG tablet Take 0.5 tablets (12.5 mg total) by mouth 2 (two) times daily.  . Ascorbic Acid (VITAMIN C PO) Take 1 tablet by mouth daily.   . bisacodyl (DULCOLAX) 5 MG EC tablet Take 5 mg by mouth once.  . Continuous Blood Gluc Receiver (FREESTYLE LIBRE 14 DAY READER) DEVI 1 Device by Does not apply route QID. Use to check blood glucose 3 times Bell day (Patient not taking: Reported on 03/19/2019)  . Continuous Blood Gluc Sensor (FREESTYLE LIBRE 14 DAY SENSOR) MISC USE AS DIRECTED EVERY 14 DAYS (Patient not taking: Reported on 07/22/2019)  . glucose blood test strip Use as instructed (Patient not taking: Reported on 03/19/2019)  .  losartan (COZAAR) 50 MG tablet Take 1 tablet (50 mg total) by mouth daily. (Patient not taking: Reported on 07/22/2019)  . omeprazole (PRILOSEC) 20 MG capsule Take 1 capsule (20 mg total) by mouth 2 (two) times daily. (Patient not taking: Reported on 07/22/2019)  . simvastatin (ZOCOR) 20 MG tablet Take 1 tablet (20 mg total) by mouth at bedtime. (Patient not taking: Reported on 07/22/2019)   No current facility-administered medications on file prior to visit.     Allergies:   Tylenol [acetaminophen], Amlodipine besylate,  Lisinopril-hydrochlorothiazide, Bactrim [sulfamethoxazole-trimethoprim], Cephalexin, Doxycycline, Gentamicin sulfate, Shellfish allergy, Nsaids, Penicillins, and Tetracyclines & related   Social History   Tobacco Use  . Smoking status: Never Smoker  . Smokeless tobacco: Never Used  Substance Use Topics  . Alcohol use: Not Currently  . Drug use: Never    Family History: The patient's family history includes Diabetes in her brother and sister; Hypertension in her father. There is no history of Colon cancer, Esophageal cancer, Rectal cancer, or Stomach cancer.  ROS:   Please see the history of present illness.  All other pertinent ROS reviewed and negative.  EKGs/Labs/Other Studies Reviewed:    The following studies were reviewed today: Monitor 08/02/19 14 days of data on Zio patch.  Patient had Bell min HR of 61 bpm, max HR of 193 bpm, and avg HR of 86 bpm. Predominant underlying rhythm was Sinus Rhythm. Bundle Branch Block/IVCD was present. 58 Supraventricular Tachycardia runs occurred, the run with the fastest interval lasting 42.4 secs with Bell max rate of 193 bpm, the longest lasting 8 mins 56 secs with an avg rate of 137 bpm. PAC burden <1%. PVC burden 1.9%, with rare couplets and no triplets. Ventricular Bigeminy and Trigeminy were present. There were zero patient triggered events. No VT, pauses, or high degree AV block.   Echo 06/04/18 Study Conclusions  - Left ventricle: The cavity size was normal. Wall thickness was   normal. Systolic function was normal. The estimated ejection   fraction was in the range of 60% to 65%. Wall motion was normal;   there were no regional wall motion abnormalities. Doppler   parameters are consistent with abnormal left ventricular   relaxation (grade 1 diastolic dysfunction). Doppler parameters   are consistent with elevated mean left atrial filling pressure. - Left atrium: The atrium was mildly dilated. - Pericardium, extracardiac: Bell trivial  pericardial effusion was   identified.  EKG:  EKG is personally reviewed.  The ekg ordered today demonstrates normal sinus rhythm with LBBB, nonspecific ST-T changes.  Recent Labs: 03/19/2019: ALT 24; BUN 13; Creatinine, Ser 0.90; Potassium 4.7; Sodium 142  Recent Lipid Panel    Component Value Date/Time   CHOL 230 (H) 03/19/2019 1455   TRIG 515 (H) 03/19/2019 1455   HDL 34 (L) 03/19/2019 1455   CHOLHDL 6.8 (H) 03/19/2019 1455   LDLCALC Comment 03/19/2019 1455    Physical Exam:    VS:  BP (!) 158/82   Pulse 72   Temp (!) 97 F (36.1 C)   Ht  (1.549 m)   Wt 159 lb 3.2 oz (72.2 kg)   SpO2 95%   BMI 30.08 kg/m    No data found.  Wt Readings from Last 3 Encounters:  07/22/19 159 lb 3.2 oz (72.2 kg)  03/19/19 157 lb 12.8 oz (71.6 kg)  08/24/18 163 lb (73.9 kg)    GEN: Well nourished, well developed in no acute distress HEENT: Normal, moist mucous membranes NECK: No  JVD CARDIAC: regular rhythm, normal S1 and S2, no rubs or gallops. No murmur. VASCULAR: Radial and DP pulses 2+ bilaterally. No carotid bruits RESPIRATORY:  Clear to auscultation without rales, wheezing or rhonchi  ABDOMEN: Soft, non-tender, non-distended MUSCULOSKELETAL:  Ambulates independently SKIN: Warm and dry, no edema NEUROLOGIC:  Alert and oriented x 3. No focal neuro deficits noted. PSYCHIATRIC:  Normal affect   ASSESSMENT:    1. Paroxysmal SVT (supraventricular tachycardia) (HCC)   2. Encounter to discuss test results   3. Essential hypertension   4. History of syncope   5. LBBB (left bundle branch block)   6. Diastolic dysfunction   7. Counseling on health promotion and disease prevention    PLAN:    Paroxysmal SVT: seen on event monitor done for syncope. Reviewed event monitor results with her today -she is asymptomatic -discussed options, including medical management vs. Discussion for ablation. Shared decision making, we both agree that given her lack of symptoms, reasonable for  medical management -if she develops symptoms, would uptitrate metoprolol -she is not interested in ablation, though if she develops refractory symptoms would readdress  History of syncope, with LBBB: -monitor did not show pauses/high risk conduction block -echo without hemodynamically significant structural issues -suspect this was related to coughing, as she has had no further events  Diastolic dysfunction by echo: euvolemic today -counseled on salt avoidance, daily weights -counseled on importance of BP control  Hypertension: has stopped her ARB, on metoprolol only -elevated today in office. Reports good control at home -recommend she keep Bell BP log at home and follow up with Dr. Creta Levin  Primary prevention: -recommend heart healthy/Mediterranean diet, with whole grains, fruits, vegetable, fish, lean meats, nuts, and olive oil. Limit salt. -recommend moderate walking, 3-5 times/week for 30-50 minutes each session. Aim for at least 150 minutes.week. Goal should Ranette pace of 3 miles/hours, or walking 1.5 miles in 30 minutes -recommend avoidance of tobacco products. Avoid excess alcohol. -Additional risk factor control:  -Diabetes: A1c is 6.6.   -Lipids: given diabetes, would recommend statin but did not tolerate simvastatin. She is not interested in retrialing today. Would consider low dose pravastatin or rosuvastatin if she is amenable to retrial in the future.  -Blood pressure control: as above  -Weight: BMI 30 -ASCVD risk score: The 10-year ASCVD risk score Denman George DC Montez Hageman., et al., 2013) is: 24.6%   Values used to calculate the score:     Age: 85 years     Sex: Female     Is Non-Hispanic African American: No     Diabetic: Yes     Tobacco smoker: No     Systolic Blood Pressure: 158 mmHg     Is BP treated: Yes     HDL Cholesterol: 34 mg/dL     Total Cholesterol: 230 mg/dL   Plan for follow up: they will plan to follow up on her BP measurements with Dr. Creta Levin. I am happy to see  her back again in the future if there are any additional questions that arise.  Total time of encounter: 35 minutes total time of encounter, including 25 minutes spent in face-to-face patient care. This time includes coordination of care and counseling regarding test results, medications, blood pressure. Remainder of non-face-to-face time involved reviewing chart documents/testing relevant to the patient encounter and documentation in the medical record.  Madeline Red, MD, PhD [place]  [facility]   Medication Adjustments/Labs and Tests Ordered: Current medicines are reviewed at length with the patient today.  Concerns regarding medicines are outlined above.  Orders Placed This Encounter  Procedures  . EKG 12-Lead   No orders of the defined types were placed in this encounter.   Patient Instructions  Medication Instructions:  Your Physician recommend you continue on your current medication as directed.    If blood pressure is consistently more than 130/80, talk to your primary care doctor about restarting losartan or another medication for better blood pressure control.  *If you need Bell refill on your cardiac medications before your next appointment, please call your pharmacy*  Lab Work: None  Testing/Procedures: None  Follow-Up: At [facility], you and your health needs are our priority.  As part of our continuing mission to provide you with exceptional heart care, we have created designated Provider Care Teams.  These Care Teams include your primary Cardiologist (physician) and Advanced Practice Providers (APPs -  Physician Assistants and Nurse Practitioners) who all work together to provide you with the care you need, when you need it.  Your next appointment:   As needed  The format for your next appointment:   Either In Person or Virtual  Provider:   Buford Dresser, MD      Signed, Buford Dresser, MD PhD 07/22/2019 [place]

## 2019-08-18 ENCOUNTER — Telehealth: Payer: Self-pay | Admitting: Family Medicine

## 2019-08-18 NOTE — Telephone Encounter (Signed)
Pt daughter called and said that after the appt with the cardiologist they discovered that her bp was high  Please advise if she needs to Madeline Bell seen sooner   Pt mother needs translator if contacted first

## 2019-08-18 NOTE — Telephone Encounter (Signed)
Please set her up for an appointment in one month for hypertension.

## 2019-08-19 ENCOUNTER — Encounter: Payer: Self-pay | Admitting: Cardiology

## 2019-08-19 DIAGNOSIS — Z87898 Personal history of other specified conditions: Secondary | ICD-10-CM | POA: Insufficient documentation

## 2019-08-19 DIAGNOSIS — I471 Supraventricular tachycardia: Secondary | ICD-10-CM | POA: Insufficient documentation

## 2019-09-10 ENCOUNTER — Other Ambulatory Visit: Payer: Self-pay

## 2019-09-10 ENCOUNTER — Encounter: Payer: Self-pay | Admitting: Family Medicine

## 2019-09-10 ENCOUNTER — Ambulatory Visit (INDEPENDENT_AMBULATORY_CARE_PROVIDER_SITE_OTHER): Payer: Self-pay | Admitting: Family Medicine

## 2019-09-10 VITALS — BP 130/80 | HR 67 | Temp 98.0°F | Ht 61.0 in | Wt 151.6 lb

## 2019-09-10 DIAGNOSIS — E1165 Type 2 diabetes mellitus with hyperglycemia: Secondary | ICD-10-CM

## 2019-09-10 DIAGNOSIS — E1169 Type 2 diabetes mellitus with other specified complication: Secondary | ICD-10-CM

## 2019-09-10 DIAGNOSIS — E785 Hyperlipidemia, unspecified: Secondary | ICD-10-CM

## 2019-09-10 DIAGNOSIS — I10 Essential (primary) hypertension: Secondary | ICD-10-CM

## 2019-09-10 MED ORDER — ATORVASTATIN CALCIUM 20 MG PO TABS: 20.0000 mg | ORAL_TABLET | Freq: Every day | ORAL | 6 refills | Status: DC

## 2019-09-10 MED ORDER — METOPROLOL TARTRATE 25 MG PO TABS: 12.5000 mg | ORAL_TABLET | Freq: Two times a day (BID) | ORAL | 11 refills | Status: DC

## 2019-09-10 MED ORDER — LOSARTAN POTASSIUM 50 MG PO TABS: 50.0000 mg | ORAL_TABLET | Freq: Every day | ORAL | 11 refills | Status: DC

## 2019-09-10 MED ORDER — METFORMIN HCL 1000 MG PO TABS: ORAL_TABLET | ORAL | 6 refills | Status: DC

## 2019-09-10 NOTE — Patient Instructions (Addendum)
IF the blood pressures are greater than 140/90 then restart losartan 50mg  Start the atorvastatin daily at bedtime Follow up in 6 months   If you have lab work done today you will Zana contacted with your lab results within the next 2 weeks.  If you have not heard from [place] then please contact us. The fastest way to get your results is to register for My Chart.   IF you received an x-ray today, you will receive an invoice from [facility]. Please contact [facility] at [phone] with questions or concerns regarding your invoice.   IF you received labwork today, you will receive an invoice from [place]. Please contact [facility] at [phone] with questions or concerns regarding your invoice.   Our billing staff will not Daphnee able to assist you with questions regarding bills from these companies.  You will Alianys contacted with the lab results as soon as they are available. The fastest way to get your results is to activate your My Chart account. Instructions are located on the last page of this paperwork. If you have not heard from [place] regarding the results in 2 weeks, please contact this office.      Dyslipidemia Dyslipidemia is an imbalance of waxy, fat-like substances (lipids) in the blood. The body needs lipids in small amounts. Dyslipidemia often involves a high level of cholesterol or triglycerides, which are types of lipids. Common forms of dyslipidemia include:  High levels of LDL cholesterol. LDL is the type of cholesterol that causes fatty deposits (plaques) to build up in the blood vessels that carry blood away from your heart (arteries).  Low levels of HDL cholesterol. HDL cholesterol is the type of cholesterol that protects against heart disease. High levels of HDL remove the LDL buildup from arteries.  High levels of triglycerides. Triglycerides are a fatty substance in the blood that is linked to a buildup of plaques in the arteries. What are the  causes? Primary dyslipidemia is caused by changes (mutations) in genes that are passed down through families (inherited). These mutations cause several types of dyslipidemia. Secondary dyslipidemia is caused by lifestyle choices and diseases that lead to dyslipidemia, such as:  Eating a diet that is high in animal fat.  Not getting enough exercise.  Having diabetes, kidney disease, liver disease, or thyroid disease.  Drinking large amounts of alcohol.  Using certain medicines. What increases the risk? You are more likely to develop this condition if you are an older man or if you are a woman who has gone through menopause. Other risk factors include:  Having a family history of dyslipidemia.  Taking certain medicines, including birth control pills, steroids, some diuretics, and beta-blockers.  Smoking cigarettes.  Eating a high-fat diet.  Having certain medical conditions such as diabetes, polycystic ovary syndrome (PCOS), kidney disease, liver disease, or hypothyroidism.  Not exercising regularly.  Being overweight or obese with too much belly fat. What are the signs or symptoms? In most cases, dyslipidemia does not usually cause any symptoms. In severe cases, very high lipid levels can cause:  Fatty bumps under the skin (xanthomas).  White or gray ring around the black center (pupil) of the eye. Very high triglyceride levels can cause inflammation of the pancreas (pancreatitis). How is this diagnosed? Your health care provider may diagnose dyslipidemia based on a routine blood test (fasting blood test). Because most people do not have symptoms of the condition, this blood testing (lipid profile) is done on adults age 28 and older and  is repeated every 5 years. This test checks:  Total cholesterol. This measures the total amount of cholesterol in your blood, including LDL cholesterol, HDL cholesterol, and triglycerides. A healthy number is below 200.  LDL cholesterol. The  target number for LDL cholesterol is different for each person, depending on individual risk factors. Ask your health care provider what your LDL cholesterol should Braylinn.  HDL cholesterol. An HDL level of 60 or higher is best because it helps to protect against heart disease. A number below 40 for men or below 50 for women increases the risk for heart disease.  Triglycerides. A healthy triglyceride number is below 150. If your lipid profile is abnormal, your health care provider may do other blood tests. How is this treated? Treatment depends on the type of dyslipidemia that you have and your other risk factors for heart disease and stroke. Your health care provider will have a target range for your lipid levels based on this information. For many people, this condition may Catalia treated by lifestyle changes, such as diet and exercise. Your health care provider may recommend that you:  Get regular exercise.  Make changes to your diet.  Quit smoking if you smoke. If diet changes and exercise do not help you reach your goals, your health care provider may also prescribe medicine to lower lipids. The most commonly prescribed type of medicine lowers your LDL cholesterol (statin drug). If you have a high triglyceride level, your provider may prescribe another type of drug (fibrate) or an omega-3 fish oil supplement, or both. Follow these instructions at home:  Eating and drinking  Follow instructions from your health care provider or dietitian about eating or drinking restrictions.  Eat a healthy diet as told by your health care provider. This can help you reach and maintain a healthy weight, lower your LDL cholesterol, and raise your HDL cholesterol. This may include: ? Limiting your calories, if you are overweight. ? Eating more fruits, vegetables, whole grains, fish, and lean meats. ? Limiting saturated fat, trans fat, and cholesterol.  If you drink alcohol: ? Limit how much you use. ? Avelynn aware  of how much alcohol is in your drink. In the [place] one drink equals one 12 oz bottle of beer (355 mL), one 5 oz glass of wine (148 mL), or one 1 oz glass of hard liquor (44 mL).  Do not drink alcohol if: ? Your health care provider tells you not to drink. ? You are pregnant, may Malajah pregnant, or are planning to become pregnant. Activity  Get regular exercise. Start an exercise and strength training program as told by your health care provider. Ask your health care provider what activities are safe for you. Your health care provider may recommend: ? 30 minutes of aerobic activity 4-6 days a week. Brisk walking is an example of aerobic activity. ? Strength training 2 days a week. General instructions  Do not use any products that contain nicotine or tobacco, such as cigarettes, e-cigarettes, and chewing tobacco. If you need help quitting, ask your health care provider.  Take over-the-counter and prescription medicines only as told by your health care provider. This includes supplements.  Keep all follow-up visits as told by your health care provider. Contact a health care provider if:  You are: ? Having trouble sticking to your exercise or diet plan. ? Struggling to quit smoking or control your use of alcohol. Summary  Dyslipidemia often involves a high level of cholesterol or triglycerides, which are  types of lipids.  Treatment depends on the type of dyslipidemia that you have and your other risk factors for heart disease and stroke.  For many people, treatment starts with lifestyle changes, such as diet and exercise.  Your health care provider may prescribe medicine to lower lipids. This information is not intended to replace advice given to you by your health care provider. Make sure you discuss any questions you have with your health care provider. Document Revised: 03/25/2018 Document Reviewed: 03/01/2018 Elsevier Patient Education  2020 [employer].

## 2019-09-10 NOTE — Progress Notes (Signed)
Established Patient Office Visit  Subjective:  Patient ID: Madeline Bell, female    DOB: [birth year]  Age: 64 y.o. MRN: [id number]  CC:  Chief Complaint  Patient presents with  . Hypertension  . Depression    husband just past way this May     HPI Madeline Bell presents for   Hypertension: Patient here for follow-up of elevated blood pressure. She is not exercising and is adherent to low salt diet.  Blood pressure is well controlled at home and is usually 140s over 60s but rarely 150s. Cardiac symptoms none. Patient denies chest pain, chest pressure/discomfort, dyspnea, exertional chest pressure/discomfort, fatigue, irregular heart beat and lower extremity edema.  Cardiovascular risk factors: diabetes mellitus, dyslipidemia and hypertension. Use of agents associated with hypertension: none. History of target organ damage: heart failure.  She is only on the metoprolol.  The losartan was stopped to see if it was the cause of the cough. It wasn't.  BP Readings from Last 3 Encounters:  09/10/19 130/80  07/22/19 (!) 158/82  03/19/19 116/74    Her cough is less frequent and seems to Madeline Bell after eating foods without chewing.   Dyslipidemia and Diabetes She is eating a better diet  She is not taking simvastatin because it caused her to have a rash She has a seafood allergy and does not eat fish She denies chest pains or palpitations   She has comorbid diabetes Lab Results  Component Value Date   HGBA1C 6.6 (A) 03/19/2019   She denies hypoglycemia She is on metformin daily She tries to stick to a diabetic diet She takes the metformin 1097m bid She denies diarrhea from the medication  Lab Results  Component Value Date   CHOL 230 (H) 03/19/2019   CHOL 266 (H) 02/20/2018   Lab Results  Component Value Date   HDL 34 (L) 03/19/2019   HDL 35 (L) 02/20/2018   Lab Results  Component Value Date   LDLCALC Comment 03/19/2019   LDLCALC 166 (H) 02/20/2018   Lab Results  Component Value Date    TRIG 515 (H) 03/19/2019   TRIG 325 (H) 02/20/2018   Lab Results  Component Value Date   CHOLHDL 6.8 (H) 03/19/2019   CHOLHDL 7.6 (H) 02/20/2018   No results found for: LDLDIRECT   Past Medical History:  Diagnosis Date  . [place]   . [place]   . Dysphagia    esophageal dysphagia  . GERD (gastroesophageal reflux disease)   . Heart murmur   . Hiatal hernia 03/26/2018   EGD 03/26/18 shows gastritis and hiatal hernia 4cm  . Hypertension   . Sickle cell anemia (HCC)    trait    Past Surgical History:  Procedure Laterality Date  . UPPER GASTROINTESTINAL ENDOSCOPY  03/26/2018    Family History  Problem Relation Age of Onset  . Hypertension Father   . Diabetes Sister   . Diabetes Brother   . Colon cancer Neg Hx   . Esophageal cancer Neg Hx   . Rectal cancer Neg Hx   . Stomach cancer Neg Hx     Social History   Socioeconomic History  . Marital status: Married    Spouse name: Not on file  . Number of children: Not on file  . Years of education: Not on file  . Highest education level: Not on file  Occupational History  . Not on file  Tobacco Use  . Smoking status: Never Smoker  .  Smokeless tobacco: Never Used  Substance and Sexual Activity  . Alcohol use: Not Currently  . Drug use: Never  . Sexual activity: Not on file  Other Topics Concern  . Not on file  Social History Narrative  . Not on file   Social Determinants of Health   Financial Resource Strain:   . Difficulty of Paying Living Expenses: Not on file  Food Insecurity:   . Worried About Charity fundraiser in the Last Year: Not on file  . Ran Out of Food in the Last Year: Not on file  Transportation Needs:   . Lack of Transportation (Medical): Not on file  . Lack of Transportation (Non-Medical): Not on file  Physical Activity:   . Days of Exercise per Week: Not on file  . Minutes of Exercise per Session: Not on file  Stress:   .  Feeling of Stress : Not on file  Social Connections:   . Frequency of Communication with Friends and Family: Not on file  . Frequency of Social Gatherings with Friends and Family: Not on file  . Attends Religious Services: Not on file  . Active Member of Clubs or Organizations: Not on file  . Attends Archivist Meetings: Not on file  . Marital Status: Not on file  Intimate Partner Violence:   . Fear of Current or Ex-Partner: Not on file  . Emotionally Abused: Not on file  . Physically Abused: Not on file  . Sexually Abused: Not on file    Outpatient Medications Prior to Visit  Medication Sig Dispense Refill  . loratadine (CLARITIN) 10 MG tablet TAKE 1 TABLET BY MOUTH EVERY DAY 90 tablet 0  . metFORMIN (GLUCOPHAGE) 1000 MG tablet TAKE 1 TABLET BY MOUTH TWICE A DAY WITH MEAL 60 tablet 6  . metoprolol tartrate (LOPRESSOR) 25 MG tablet Take 0.5 tablets (12.5 mg total) by mouth 2 (two) times daily. 30 tablet 11  . Ascorbic Acid (VITAMIN C PO) Take 1 tablet by mouth daily.     Marland Kitchen losartan (COZAAR) 50 MG tablet Take 1 tablet (50 mg total) by mouth daily. (Patient not taking: Reported on 07/22/2019) 30 tablet 11  . simvastatin (ZOCOR) 20 MG tablet Take 1 tablet (20 mg total) by mouth at bedtime. (Patient not taking: Reported on 07/22/2019) 90 tablet 3  . bisacodyl (DULCOLAX) 5 MG EC tablet Take 5 mg by mouth once.    . Continuous Blood Gluc Receiver (FREESTYLE LIBRE 14 DAY READER) DEVI 1 Device by Does not apply route QID. Use to check blood glucose 3 times a day (Patient not taking: Reported on 03/19/2019) 1 Device 0  . Continuous Blood Gluc Sensor (FREESTYLE LIBRE 14 DAY SENSOR) MISC USE AS DIRECTED EVERY 14 DAYS (Patient not taking: Reported on 07/22/2019) 2 each 6  . glucose blood test strip Use as instructed (Patient not taking: Reported on 03/19/2019) 100 each 12  . omeprazole (PRILOSEC) 20 MG capsule Take 1 capsule (20 mg total) by mouth 2 (two) times daily. (Patient not taking: Reported  on 07/22/2019) 60 capsule 5   No facility-administered medications prior to visit.    Allergies  Allergen Reactions  . Tylenol [Acetaminophen] Rash  . Amlodipine Besylate Rash  . Lisinopril-Hydrochlorothiazide   . Bactrim [Sulfamethoxazole-Trimethoprim]     Rash   . Cephalexin   . Doxycycline     Rash and itching and lips feel burnt, no anaphylaxis  . Gentamicin Sulfate   . Shellfish Allergy   . Nsaids Rash  .  Penicillins Rash and Other (See Comments)    Burning   Has patient had a PCN reaction causing immediate rash, facial/tongue/throat swelling, SOB or lightheadedness with hypotension: Yes Has patient had a PCN reaction causing severe rash involving mucus membranes or skin necrosis: No Has patient had a PCN reaction that required hospitalization: No Has patient had a PCN reaction occurring within the last 10 years: No If all of the above answers are "NO", then may proceed with Cephalosporin use.   . Tetracyclines & Related Rash    ROS Review of Systems Review of Systems  Constitutional: Negative for activity change, appetite change, chills and fever.  HENT: Negative for congestion, nosebleeds, trouble swallowing and voice change.   Respiratory: Negative for cough, shortness of breath and wheezing.   Gastrointestinal: Negative for diarrhea, nausea and vomiting.  Genitourinary: Negative for difficulty urinating, dysuria, flank pain and hematuria.  Musculoskeletal: Negative for back pain, joint swelling and neck pain.  Neurological: Negative for dizziness, speech difficulty, light-headedness and numbness.  See HPI. All other review of systems negative.     Objective:    Physical Exam  BP 130/80   Pulse 67   Temp 98 F (36.7 C) (Temporal)   Ht '5\' 1"'  (1.549 m)   Wt 151 lb 9.6 oz (68.8 kg)   SpO2 96%   BMI 28.64 kg/m  Wt Readings from Last 3 Encounters:  09/10/19 151 lb 9.6 oz (68.8 kg)  07/22/19 159 lb 3.2 oz (72.2 kg)  03/19/19 157 lb 12.8 oz (71.6 kg)    Physical Exam  Constitutional: Oriented to person, place, and time. Appears well-developed and well-nourished.  HENT:  Head: Normocephalic and atraumatic.  Eyes: Conjunctivae and EOM are normal.  Cardiovascular: Normal rate, regular rhythm, normal heart sounds and intact distal pulses.  No murmur heard. Pulmonary/Chest: Effort normal and breath sounds normal. No stridor. No respiratory distress. Has no wheezes.  Neurological: Is alert and oriented to person, place, and time.  Skin: Skin is warm. Capillary refill takes less than 2 seconds.  Psychiatric: Has a normal mood and affect. Behavior is normal. Judgment and thought content normal.    Health Maintenance Due  Topic Date Due  . Hepatitis C Screening  1956-08-10  . URINE MICROALBUMIN  08/14/1965  . PAP SMEAR-Modifier  08/14/1976  . MAMMOGRAM  08/14/2005  . COLONOSCOPY  08/14/2005  . INFLUENZA VACCINE  03/15/2019    There are no preventive care reminders to display for this patient.  Lab Results  Component Value Date   TSH 2.189 06/04/2018   Lab Results  Component Value Date   WBC 9.2 06/06/2018   HGB 10.1 (L) 06/06/2018   HCT 32.5 (L) 06/06/2018   MCV 84.0 06/06/2018   PLT 354 06/06/2018   Lab Results  Component Value Date   NA 142 03/19/2019   K 4.7 03/19/2019   CO2 22 03/19/2019   GLUCOSE 124 (H) 03/19/2019   BUN 13 03/19/2019   CREATININE 0.90 03/19/2019   BILITOT <0.2 03/19/2019   ALKPHOS 91 03/19/2019   AST 20 03/19/2019   ALT 24 03/19/2019   PROT 7.5 03/19/2019   ALBUMIN 4.7 03/19/2019   CALCIUM 9.9 03/19/2019   ANIONGAP 6 06/06/2018   Lab Results  Component Value Date   CHOL 230 (H) 03/19/2019   Lab Results  Component Value Date   HDL 34 (L) 03/19/2019   Lab Results  Component Value Date   LDLCALC Comment 03/19/2019   Lab Results  Component Value Date  TRIG 515 (H) 03/19/2019   Lab Results  Component Value Date   CHOLHDL 6.8 (H) 03/19/2019   Lab Results  Component Value Date    HGBA1C 6.6 (A) 03/19/2019      Assessment & Plan:   Problem List Items Addressed This Visit      Cardiovascular and Mediastinum   Essential hypertension - Primary Continue current meds  Home bp monitoring is in range  Advised to call    Relevant Orders   CMP14+EGFR    Other Visit Diagnoses    Dyslipidemia associated with type 2 diabetes mellitus ([place])       Relevant Orders   CMP14+EGFR   Lipid panel   Hemoglobin A1c     1. Discussed that if she continues to have high 785Y to 850Y systolic to restart losartan 2. Diabetes is well controlled. advsied statin drug. Changed prescription to lipitor 3. Dyslipidemia - advised lipitor, if she cannot tolerate then will refer to lipid clinic as she cannot take fish based meds  Follow up in 6 months  No orders of the defined types were placed in this encounter.   Follow-up: No follow-ups on file.    Forrest Moron, MD

## 2019-09-11 LAB — CMP14+EGFR
ALT: 21 IU/L (ref 0–32)
AST: 22 IU/L (ref 0–40)
Albumin/Globulin Ratio: 1.9 (ref 1.2–2.2)
Albumin: 4.5 g/dL (ref 3.8–4.8)
Alkaline Phosphatase: 79 IU/L (ref 39–117)
BUN/Creatinine Ratio: 15 (ref 12–28)
BUN: 14 mg/dL (ref 8–27)
Bilirubin Total: 0.3 mg/dL (ref 0.0–1.2)
CO2: 18 mmol/L — ABNORMAL LOW (ref 20–29)
Calcium: 9.4 mg/dL (ref 8.7–10.3)
Chloride: 102 mmol/L (ref 96–106)
Creatinine, Ser: 0.92 mg/dL (ref 0.57–1.00)
GFR calc Af Amer: 76 mL/min/{1.73_m2} (ref >59)
GFR calc non Af Amer: 66 mL/min/{1.73_m2} (ref >59)
Globulin, Total: 2.4 g/dL (ref 1.5–4.5)
Glucose: 149 mg/dL — ABNORMAL HIGH (ref 65–99)
Potassium: 4.2 mmol/L (ref 3.5–5.2)
Sodium: 138 mmol/L (ref 134–144)
Total Protein: 6.9 g/dL (ref 6.0–8.5)

## 2019-09-11 LAB — LIPID PANEL
Chol/HDL Ratio: 6.6 ratio — ABNORMAL HIGH (ref 0.0–4.4)
Cholesterol, Total: 210 mg/dL — ABNORMAL HIGH (ref 100–199)
HDL: 32 mg/dL — ABNORMAL LOW (ref >39)
LDL Chol Calc (NIH): 130 mg/dL — ABNORMAL HIGH (ref 0–99)
Triglycerides: 266 mg/dL — ABNORMAL HIGH (ref 0–149)
VLDL Cholesterol Cal: 48 mg/dL — ABNORMAL HIGH (ref 5–40)

## 2019-09-11 LAB — HEMOGLOBIN A1C
Est. average glucose Bld gHb Est-mCnc: 126 mg/dL
Hgb A1c MFr Bld: 6 % — ABNORMAL HIGH (ref 4.8–5.6)

## 2019-09-15 ENCOUNTER — Ambulatory Visit: Payer: Self-pay | Admitting: Family Medicine

## 2019-10-02 ENCOUNTER — Other Ambulatory Visit: Payer: Self-pay | Admitting: Family Medicine

## 2019-10-02 DIAGNOSIS — R059 Cough, unspecified: Secondary | ICD-10-CM

## 2019-10-02 DIAGNOSIS — R05 Cough: Secondary | ICD-10-CM

## 2019-10-22 ENCOUNTER — Other Ambulatory Visit: Payer: Self-pay | Admitting: Family Medicine

## 2019-10-31 ENCOUNTER — Telehealth: Payer: Self-pay

## 2019-10-31 NOTE — Telephone Encounter (Signed)
Called pt, left message for pt to call bk regarding covid vaccine concern

## 2019-11-17 ENCOUNTER — Encounter: Payer: Self-pay | Admitting: Family Medicine

## 2019-11-17 ENCOUNTER — Ambulatory Visit (INDEPENDENT_AMBULATORY_CARE_PROVIDER_SITE_OTHER): Payer: Self-pay | Admitting: Family Medicine

## 2019-11-17 ENCOUNTER — Telehealth: Payer: Self-pay | Admitting: Family Medicine

## 2019-11-17 ENCOUNTER — Other Ambulatory Visit: Payer: Self-pay

## 2019-11-17 VITALS — BP 132/83 | HR 93 | Temp 98.0°F | Ht 61.0 in | Wt 157.0 lb

## 2019-11-17 DIAGNOSIS — E785 Hyperlipidemia, unspecified: Secondary | ICD-10-CM

## 2019-11-17 DIAGNOSIS — E1165 Type 2 diabetes mellitus with hyperglycemia: Secondary | ICD-10-CM

## 2019-11-17 DIAGNOSIS — E1169 Type 2 diabetes mellitus with other specified complication: Secondary | ICD-10-CM

## 2019-11-17 LAB — POCT GLYCOSYLATED HEMOGLOBIN (HGB A1C): Hemoglobin A1C: 6.9 % — AB (ref 4.0–5.6)

## 2019-11-17 MED ORDER — ATORVASTATIN CALCIUM 20 MG PO TABS: 20.0000 mg | ORAL_TABLET | ORAL | 3 refills | Status: DC

## 2019-11-17 NOTE — Patient Instructions (Addendum)
Start taking your atorvastatin 20mg  every other day for cholesterol. Continue all your other medications.    Dyslipidemia Dyslipidemia is an imbalance of waxy, fat-like substances (lipids) in the blood. The body needs lipids in small amounts. Dyslipidemia often involves a high level of cholesterol or triglycerides, which are types of lipids. Common forms of dyslipidemia include:  High levels of LDL cholesterol. LDL is the type of cholesterol that causes fatty deposits (plaques) to build up in the blood vessels that carry blood away from your heart (arteries).  Low levels of HDL cholesterol. HDL cholesterol is the type of cholesterol that protects against heart disease. High levels of HDL remove the LDL buildup from arteries.  High levels of triglycerides. Triglycerides are a fatty substance in the blood that is linked to a buildup of plaques in the arteries. What are the causes? Primary dyslipidemia is caused by changes (mutations) in genes that are passed down through families (inherited). These mutations cause several types of dyslipidemia. Secondary dyslipidemia is caused by lifestyle choices and diseases that lead to dyslipidemia, such as:  Eating a diet that is high in animal fat.  Not getting enough exercise.  Having diabetes, kidney disease, liver disease, or thyroid disease.  Drinking large amounts of alcohol.  Using certain medicines. What increases the risk? You are more likely to develop this condition if you are an older man or if you are a woman who has gone through menopause. Other risk factors include:  Having a family history of dyslipidemia.  Taking certain medicines, including birth control pills, steroids, some diuretics, and beta-blockers.  Smoking cigarettes.  Eating a high-fat diet.  Having certain medical conditions such as diabetes, polycystic ovary syndrome (PCOS), kidney disease, liver disease, or hypothyroidism.  Not exercising regularly.  Being  overweight or obese with too much belly fat. What are the signs or symptoms? In most cases, dyslipidemia does not usually cause any symptoms. In severe cases, very high lipid levels can cause:  Fatty bumps under the skin (xanthomas).  White or gray ring around the black center (pupil) of the eye. Very high triglyceride levels can cause inflammation of the pancreas (pancreatitis). How is this diagnosed? Your health care provider may diagnose dyslipidemia based on a routine blood test (fasting blood test). Because most people do not have symptoms of the condition, this blood testing (lipid profile) is done on adults age 89 and older and is repeated every 5 years. This test checks:  Total cholesterol. This measures the total amount of cholesterol in your blood, including LDL cholesterol, HDL cholesterol, and triglycerides. A healthy number is below 200.  LDL cholesterol. The target number for LDL cholesterol is different for each person, depending on individual risk factors. Ask your health care provider what your LDL cholesterol should [place].  HDL cholesterol. An HDL level of 60 or higher is best because it helps to protect against heart disease. A number below 40 for men or below 50 for women increases the risk for heart disease.  Triglycerides. A healthy triglyceride number is below 150. If your lipid profile is abnormal, your health care provider may do other blood tests. How is this treated? Treatment depends on the type of dyslipidemia that you have and your other risk factors for heart disease and stroke. Your health care provider will have a target range for your lipid levels based on this information. For many people, this condition may [place] treated by lifestyle changes, such as diet and exercise. Your health care provider may  recommend that you:  Get regular exercise.  Make changes to your diet.  Quit smoking if you smoke. If diet changes and exercise do not help you reach your goals,  your health care provider may also prescribe medicine to lower lipids. The most commonly prescribed type of medicine lowers your LDL cholesterol (statin drug). If you have a high triglyceride level, your provider may prescribe another type of drug (fibrate) or an omega-3 fish oil supplement, or both. Follow these instructions at home:  Eating and drinking  Follow instructions from your health care provider or dietitian about eating or drinking restrictions.  Eat a healthy diet as told by your health care provider. This can help you reach and maintain a healthy weight, lower your LDL cholesterol, and raise your HDL cholesterol. This may include: ? Limiting your calories, if you are overweight. ? Eating more fruits, vegetables, whole grains, fish, and lean meats. ? Limiting saturated fat, trans fat, and cholesterol.  If you drink alcohol: ? Limit how much you use. ? Carol[place]nn aware of how much alcohol is in your drink. In the [place] one drink equals one 12 oz bottle of beer (355 mL), one 5 oz glass of wine (148 mL), or one 1 oz glass of hard liquor (44 mL).  Do not drink alcohol if: ? Your health care provider tells you not to drink. ? You are pregnant, may [place] pregnant, or are planning to become pregnant. Activity  Get regular exercise. Start an exercise and strength training program as told by your health care provider. Ask your health care provider what activities are safe for you. Your health care provider may recommend: ? 30 minutes of aerobic activity 4-6 days a week. Brisk walking is an example of aerobic activity. ? Strength training 2 days a week. General instructions  Do not use any products that contain nicotine or tobacco, such as cigarettes, e-cigarettes, and chewing tobacco. If you need help quitting, ask your health care provider.  Take over-the-counter and prescription medicines only as told by your health care provider. This includes supplements.  Keep all follow-up visits as  told by your health care provider. Contact a health care provider if:  You are: ? Having trouble sticking to your exercise or diet plan. ? Struggling to quit smoking or control your use of alcohol. Summary  Dyslipidemia often involves a high level of cholesterol or triglycerides, which are types of lipids.  Treatment depends on the type of dyslipidemia that you have and your other risk factors for heart disease and stroke.  For many people, treatment starts with lifestyle changes, such as diet and exercise.  Your health care provider may prescribe medicine to lower lipids. This information is not intended to replace advice given to you by your health care provider. Make sure you discuss any questions you have with your health care provider. Document Revised: 03/25/2018 Document Reviewed: 03/01/2018 Elsevier Patient Education  [place].

## 2019-11-17 NOTE — Progress Notes (Signed)
Established Patient Office Visit  Subjective:  Patient ID: Madeline Bell, female    DOB: [birth year]  Age: 64 y.o. MRN: [id number]  CC:  Chief Complaint  Patient presents with  . Diabetes  . Hypertension  . covid vaccine questions    HPI Madeline Bell presents for    Diabetes Mellitus: Patient presents for follow up of diabetes. Symptoms: none. Symptoms have stabilized. Patient denies foot ulcerations, hypoglycemia , increase appetite, nausea, polydipsia and visual disturbances.  Evaluation to date has been included: hemoglobin A1C.  Home sugars: patient does not check sugars. Treatment to date: Continued metformin which has been effective.  Lab Results  Component Value Date   HGBA1C 6.9 (A) 11/17/2019   Hypertension: Patient here for follow-up of elevated blood pressure. She is exercising and is adherent to low salt diet.  Blood pressure is well controlled at home. Cardiac symptoms none. Patient denies chest pain, chest pressure/discomfort, dyspnea, fatigue, lower extremity edema, near-syncope and orthopnea.  Cardiovascular risk factors: diabetes mellitus and hypertension. Use of agents associated with hypertension: none. History of target organ damage: none. BP Readings from Last 3 Encounters:  11/17/19 132/83  09/10/19 130/80  07/22/19 (!) 158/82      Past Medical History:  Diagnosis Date  . CHF (congestive heart failure) ([place])   . Diabetes mellitus without complication ([place])   . Dysphagia    esophageal dysphagia  . GERD (gastroesophageal reflux disease)   . Heart murmur   . Hiatal hernia 03/26/2018   EGD 03/26/18 shows gastritis and hiatal hernia 4cm  . Hypertension   . Sickle cell anemia (HCC)    trait    Past Surgical History:  Procedure Laterality Date  . UPPER GASTROINTESTINAL ENDOSCOPY  03/26/2018    Family History  Problem Relation Age of Onset  . Hypertension Father   . Diabetes Sister   . Diabetes Brother   . Colon cancer Neg Hx   . Esophageal cancer Neg Hx     . Rectal cancer Neg Hx   . Stomach cancer Neg Hx     Social History   Socioeconomic History  . Marital status: Married    Spouse name: Not on file  . Number of children: Not on file  . Years of education: Not on file  . Highest education level: Not on file  Occupational History  . Not on file  Tobacco Use  . Smoking status: Never Smoker  . Smokeless tobacco: Never Used  Substance and Sexual Activity  . Alcohol use: Not Currently  . Drug use: Never  . Sexual activity: Not on file  Other Topics Concern  . Not on file  Social History Narrative  . Not on file   Social Determinants of Health   Financial Resource Strain:   . Difficulty of Paying Living Expenses:   Food Insecurity:   . Worried About Charity fundraiser in the Last Year:   . Arboriculturist in the Last Year:   Transportation Needs:   . Film/video editor (Medical):   Marland Kitchen Lack of Transportation (Non-Medical):   Physical Activity:   . Days of Exercise per Week:   . Minutes of Exercise per Session:   Stress:   . Feeling of Stress :   Social Connections:   . Frequency of Communication with Friends and Family:   . Frequency of Social Gatherings with Friends and Family:   . Attends Religious Services:   . Active Member of Clubs or Organizations:   .  Attends Banker Meetings:   Marland Kitchen Marital Status:   Intimate Partner Violence:   . Fear of Current or Ex-Partner:   . Emotionally Abused:   Marland Kitchen Physically Abused:   . Sexually Abused:     Outpatient Medications Prior to Visit  Medication Sig Dispense Refill  . losartan (COZAAR) 50 MG tablet Take 1 tablet (50 mg total) by mouth daily. 30 tablet 11  . metFORMIN (GLUCOPHAGE) 1000 MG tablet TAKE 1 TABLET BY MOUTH TWICE A DAY WITH MEAL 60 tablet 6  . atorvastatin (LIPITOR) 20 MG tablet Take 1 tablet (20 mg total) by mouth daily. 30 tablet 6  . loratadine (CLARITIN) 10 MG tablet TAKE 1 TABLET BY MOUTH EVERY DAY 90 tablet 2  . metoprolol tartrate  (LOPRESSOR) 25 MG tablet Take 0.5 tablets (12.5 mg total) by mouth 2 (two) times daily. (Patient not taking: Reported on 11/17/2019) 30 tablet 11  . simvastatin (ZOCOR) 20 MG tablet Take 1 tablet (20 mg total) by mouth at bedtime. (Patient not taking: Reported on 07/22/2019) 90 tablet 3  . Ascorbic Acid (VITAMIN C PO) Take 1 tablet by mouth daily.      No facility-administered medications prior to visit.    Allergies  Allergen Reactions  . Tylenol [Acetaminophen] Rash  . Amlodipine Besylate Rash  . Lisinopril-Hydrochlorothiazide   . Bactrim [Sulfamethoxazole-Trimethoprim]     Rash   . Cephalexin   . Doxycycline     Rash and itching and lips feel burnt, no anaphylaxis  . Gentamicin Sulfate   . Shellfish Allergy   . Nsaids Rash  . Penicillins Rash and Other (See Comments)    Burning   Has patient had a PCN reaction causing immediate rash, facial/tongue/throat swelling, SOB or lightheadedness with hypotension: Yes Has patient had a PCN reaction causing severe rash involving mucus membranes or skin necrosis: No Has patient had a PCN reaction that required hospitalization: No Has patient had a PCN reaction occurring within the last 10 years: No If all of the above answers are "NO", then may proceed with Cephalosporin use.   . Tetracyclines & Related Rash    ROS Review of Systems Review of Systems  Constitutional: Negative for activity change, appetite change, chills and fever.  HENT: Negative for congestion, nosebleeds, trouble swallowing and voice change.   Respiratory: Negative for cough, shortness of breath and wheezing.   Gastrointestinal: Negative for diarrhea, nausea and vomiting.  Genitourinary: Negative for difficulty urinating, dysuria, flank pain and hematuria.  Musculoskeletal: Negative for back pain, joint swelling and neck pain.  Neurological: Negative for dizziness, speech difficulty, light-headedness and numbness.  See HPI. All other review of systems negative.       Objective:    Physical Exam  BP 132/83   Pulse 93   Temp 98 F (36.7 C)   Ht 5\' 1"  (1.549 m)   Wt 157 lb (71.2 kg)   SpO2 95%   BMI 29.66 kg/m  Wt Readings from Last 3 Encounters:  11/17/19 157 lb (71.2 kg)  09/10/19 151 lb 9.6 oz (68.8 kg)  07/22/19 159 lb 3.2 oz (72.2 kg)   Physical Exam  Constitutional: Oriented to person, place, and time. Appears well-developed and well-nourished.  HENT:  Head: Normocephalic and atraumatic.  Eyes: Conjunctivae and EOM are normal.  Cardiovascular: Normal rate, regular rhythm, normal heart sounds and intact distal pulses.  No murmur heard. Pulmonary/Chest: Effort normal and breath sounds normal. No stridor. No respiratory distress. Has no wheezes.  Neurological: Is alert  and oriented to person, place, and time.  Skin: Skin is warm. Capillary refill takes less than 2 seconds.  Psychiatric: Has a normal mood and affect. Behavior is normal. Judgment and thought content normal.    Health Maintenance Due  Topic Date Due  . Hepatitis C Screening  Never done  . PAP SMEAR-Modifier  Never done  . MAMMOGRAM  Never done  . COLONOSCOPY  Never done    There are no preventive care reminders to display for this patient.  Lab Results  Component Value Date   TSH 2.189 06/04/2018   Lab Results  Component Value Date   WBC 9.2 06/06/2018   HGB 10.1 (L) 06/06/2018   HCT 32.5 (L) 06/06/2018   MCV 84.0 06/06/2018   PLT 354 06/06/2018   Lab Results  Component Value Date   NA 138 09/10/2019   K 4.2 09/10/2019   CO2 18 (L) 09/10/2019   GLUCOSE 149 (H) 09/10/2019   BUN 14 09/10/2019   CREATININE 0.92 09/10/2019   BILITOT 0.3 09/10/2019   ALKPHOS 79 09/10/2019   AST 22 09/10/2019   ALT 21 09/10/2019   PROT 6.9 09/10/2019   ALBUMIN 4.5 09/10/2019   CALCIUM 9.4 09/10/2019   ANIONGAP 6 06/06/2018   Lab Results  Component Value Date   CHOL 210 (H) 09/10/2019   Lab Results  Component Value Date   HDL 32 (L) 09/10/2019   Lab Results   Component Value Date   LDLCALC 130 (H) 09/10/2019   Lab Results  Component Value Date   TRIG 266 (H) 09/10/2019   Lab Results  Component Value Date   CHOLHDL 6.6 (H) 09/10/2019   Lab Results  Component Value Date   HGBA1C 6.9 (A) 11/17/2019      Assessment & Plan:   Problem List Items Addressed This Visit    None    Visit Diagnoses    Type 2 diabetes mellitus with hyperglycemia, without long-term current use of insulin (HCC)    -  Primary well controlled hemoglobin a1c is at goal Continue exercise Lipids monitored and renal function in range On metformin On arb On asa 81mg  Reviewed diabetic foot care Emphasized importance of eye and dental exam      Relevant Medications   atorvastatin (LIPITOR) 20 MG tablet   Other Relevant Orders   POCT glycosylated hemoglobin (Hb A1C) (Completed)   Dyslipidemia associated with type 2 diabetes mellitus (HCC)    Discussed cholesterol goals for diabetes   Relevant Medications   atorvastatin (LIPITOR) 20 MG tablet       Meds ordered this encounter  Medications  . atorvastatin (LIPITOR) 20 MG tablet    Sig: Take 1 tablet (20 mg total) by mouth every other day.    Dispense:  45 tablet    Refill:  3    Follow-up: No follow-ups on file.    , MD

## 2019-11-24 ENCOUNTER — Other Ambulatory Visit: Payer: Self-pay | Admitting: Family Medicine

## 2019-11-24 DIAGNOSIS — R059 Cough, unspecified: Secondary | ICD-10-CM

## 2019-11-24 DIAGNOSIS — R05 Cough: Secondary | ICD-10-CM

## 2019-11-24 MED ORDER — LORATADINE 10 MG PO TABS: 10.0000 mg | ORAL_TABLET | Freq: Every day | ORAL | 1 refills | Status: AC

## 2019-11-24 NOTE — Telephone Encounter (Signed)
Medication: Medication loratadine (CLARITIN) 10 MG tablet [[id number]]  Has the patient contacted their pharmacy? Yes  (Agent: If no, request that the patient contact the pharmacy for the refill.) (Agent: If yes, when and what did the pharmacy advise?)  Preferred Pharmacy (with phone number or street name): [facility] [address] [place] [place], [place]. [address] [phone]  Agent: Please Aiko advised that RX refills may take up to 3 business days. We ask that you follow-up with your pharmacy.

## 2019-11-24 NOTE — Telephone Encounter (Signed)
Pt changing pharmacy- pt given the rest of previous refill. Requested Prescriptions  Pending Prescriptions Disp Refills  . loratadine (CLARITIN) 10 MG tablet 90 tablet 1    Sig: Take 1 tablet (10 mg total) by mouth daily.     Ear, Nose, and Throat:  Antihistamines Passed - 11/24/2019  9:09 AM      Passed - Valid encounter within last 12 months    Recent Outpatient Visits          1 week ago Type 2 diabetes mellitus with hyperglycemia, without long-term current use of insulin (HCC)   Primary Care at [facility], Manus Rudd, MD   2 months ago Essential hypertension   Primary Care at [facility], [place] A, MD   8 months ago Type 2 diabetes mellitus without complication, without long-term current use of insulin [facility])   Primary Care at [facility], Manus Rudd, MD   1 year ago Travel advice encounter   Primary Care at [place], [place], DO   1 year ago Cough syncope   Primary Care at [facility], Manus Rudd, MD      Future Appointments            In 3 months Doristine Bosworth, MD Primary Care at [place], [facility]

## 2020-03-10 ENCOUNTER — Ambulatory Visit: Payer: Self-pay | Admitting: Family Medicine

## 2020-05-24 ENCOUNTER — Telehealth: Payer: Self-pay | Admitting: *Deleted

## 2020-05-24 ENCOUNTER — Other Ambulatory Visit: Payer: Self-pay | Admitting: Family Medicine

## 2020-05-24 DIAGNOSIS — E1165 Type 2 diabetes mellitus with hyperglycemia: Secondary | ICD-10-CM

## 2020-05-24 MED ORDER — METFORMIN HCL 1000 MG PO TABS: ORAL_TABLET | ORAL | 1 refills | Status: DC

## 2020-05-24 NOTE — Telephone Encounter (Signed)
Patient is requesting refill for her Freestyle Libre 14 day sensor.  It was discontinued in 2020 and never reordered.  Pharmacy on file.  Patient has appointment with Macario Carls Just, NP on 06/14/20.  Pharmacy will need new prescription-routing to clinic for order.

## 2020-05-24 NOTE — Telephone Encounter (Signed)
Informed Mrs. Candela's daughter of the response from the provider regarding the request for refill with the [facility] sensors. She was okay with this as the patient has improved her eating habits/exercise and has lot weight.

## 2020-05-24 NOTE — Telephone Encounter (Signed)
We can discuss restarting at her appt but for now we will not Stewart starting new Rx

## 2020-05-24 NOTE — Telephone Encounter (Signed)
Approved per protocol. Appointment made for 06/14/20 with Macario Carls Just, NP

## 2020-05-24 NOTE — Telephone Encounter (Signed)
Medication: metFORMIN (GLUCOPHAGE) 1000 MG tablet [[id number]] , Free Style Libre  Has the patient contacted their pharmacy? YES (Agent: If no, request that the patient contact the pharmacy for the refill.) (Agent: If yes, when and what did the pharmacy advise?)  Preferred Pharmacy (with phone number or street name): [facility] [address] [place] - [address] [facility] [address] [place] [address] Phone: [phone] Fax: [phone] Hours: Not open 24 hours    Agent: Please Drake advised that RX refills may take up to 3 business days. We ask that you follow-up with your pharmacy.

## 2020-06-14 ENCOUNTER — Other Ambulatory Visit: Payer: Self-pay

## 2020-06-14 ENCOUNTER — Encounter: Payer: Self-pay | Admitting: Family Medicine

## 2020-06-14 ENCOUNTER — Ambulatory Visit (INDEPENDENT_AMBULATORY_CARE_PROVIDER_SITE_OTHER): Payer: Self-pay | Admitting: Family Medicine

## 2020-06-14 VITALS — BP 136/81 | HR 79 | Temp 98.0°F | Ht 61.0 in | Wt 155.0 lb

## 2020-06-14 DIAGNOSIS — E1169 Type 2 diabetes mellitus with other specified complication: Secondary | ICD-10-CM

## 2020-06-14 DIAGNOSIS — Z1211 Encounter for screening for malignant neoplasm of colon: Secondary | ICD-10-CM

## 2020-06-14 DIAGNOSIS — E785 Hyperlipidemia, unspecified: Secondary | ICD-10-CM

## 2020-06-14 DIAGNOSIS — E1165 Type 2 diabetes mellitus with hyperglycemia: Secondary | ICD-10-CM

## 2020-06-14 DIAGNOSIS — I10 Essential (primary) hypertension: Secondary | ICD-10-CM

## 2020-06-14 DIAGNOSIS — Z23 Encounter for immunization: Secondary | ICD-10-CM

## 2020-06-14 DIAGNOSIS — Z1159 Encounter for screening for other viral diseases: Secondary | ICD-10-CM

## 2020-06-14 LAB — POCT GLYCOSYLATED HEMOGLOBIN (HGB A1C): Hemoglobin A1C: 6.5 % — AB (ref 4.0–5.6)

## 2020-06-14 MED ORDER — METOPROLOL TARTRATE 25 MG PO TABS: 12.5000 mg | ORAL_TABLET | Freq: Two times a day (BID) | ORAL | 1 refills | Status: AC

## 2020-06-14 MED ORDER — LOSARTAN POTASSIUM 50 MG PO TABS: 50.0000 mg | ORAL_TABLET | Freq: Every day | ORAL | 3 refills | Status: DC

## 2020-06-14 MED ORDER — ATORVASTATIN CALCIUM 20 MG PO TABS: 20.0000 mg | ORAL_TABLET | ORAL | 3 refills | Status: AC

## 2020-06-14 MED ORDER — FREESTYLE LIBRE 14 DAY SENSOR MISC: 6 refills | Status: AC

## 2020-06-14 MED ORDER — METFORMIN HCL 1000 MG PO TABS: ORAL_TABLET | ORAL | 3 refills | Status: AC

## 2020-06-14 NOTE — Progress Notes (Signed)
11/1/20214:42 PM  Madeline Bell September 23, 1955, 64 y.o., female [id number]  Chief Complaint  Patient presents with  . Diabetes    free style libre refill   . Hypertension    HPI:   Patient is a 64 y.o. female with past medical history significant for HTN, DM, HLD who presents today for medication refill.  Diabetes Metformin 1058m daily Denies diarrhea, constipation, nausea Atorvastatin 24mdaily Lab Results  Component Value Date   HGBA1C 6.5 (A) 06/14/2020   Lab Results  Component Value Date   CHOL 210 (H) 09/10/2019   HDL 32 (L) 09/10/2019   LDLCALC 130 (H) 09/10/2019   TRIG 266 (H) 09/10/2019   CHOLHDL 6.6 (H) 09/10/2019   HTN Metoprolol 2580mosartan 29m3m goal< 140/90 BP Readings from Last 3 Encounters:  06/14/20 136/81  11/17/19 132/83  09/10/19 130/80    Has been working on diet and exercise. Walk on treadmill, elliptical  Depression screen PHQ [facility] 06/14/2020 11/17/2019 09/10/2019  Decreased Interest 0 0 0  Down, Depressed, Hopeless 0 0 1  PHQ - 2 Score 0 0 1    Fall Risk  06/14/2020 11/17/2019 09/10/2019 03/19/2019 08/24/2018  Falls in the past year? 0 0 0 0 0  Number falls in past yr: 0 0 0 0 -  Injury with Fall? 0 0 0 0 -  Comment - - - - -  Follow up Falls evaluation completed Falls evaluation completed Falls evaluation completed Falls evaluation completed -     Allergies  Allergen Reactions  . Tylenol [Acetaminophen] Rash  . Amlodipine Besylate Rash  . Lisinopril-Hydrochlorothiazide   . Bactrim [Sulfamethoxazole-Trimethoprim]     Rash   . Cephalexin   . Doxycycline     Rash and itching and lips feel burnt, no anaphylaxis  . Gentamicin Sulfate   . Shellfish Allergy   . Nsaids Rash  . Penicillins Rash and Other (See Comments)    Burning   Has patient had a PCN reaction causing immediate rash, facial/tongue/throat swelling, SOB or lightheadedness with hypotension: Yes Has patient had a PCN reaction causing severe rash involving mucus membranes or  skin necrosis: No Has patient had a PCN reaction that required hospitalization: No Has patient had a PCN reaction occurring within the last 10 years: No If all of the above answers are "NO", then may proceed with Cephalosporin use.   . Tetracyclines & Related Rash    Prior to Admission medications   Medication Sig Start Date End Date Taking? Authorizing Provider  atorvastatin (LIPITOR) 20 MG tablet Take 1 tablet (20 mg total) by mouth every other day. 11/17/19  Yes StalDelia ChimesMD  loratadine (CLARITIN) 10 MG tablet Take 1 tablet (10 mg total) by mouth daily. 11/24/19  Yes StalDelia ChimesMD  losartan (COZAAR) 50 MG tablet Take 1 tablet (50 mg total) by mouth daily. 09/10/19 09/09/20 Yes Stallings, Zoe A, MD  metFORMIN (GLUCOPHAGE) 1000 MG tablet TAKE 1 TABLET BY MOUTH TWICE A DAY WITH MEAL 05/24/20  Yes Oria Klimas, KelsLaurita QuintP  metoprolol tartrate (LOPRESSOR) 25 MG tablet Take 0.5 tablets (12.5 mg total) by mouth 2 (two) times daily. 09/10/19  Yes StalForrest Moron    Past Medical History:  Diagnosis Date  . CHF (congestive heart failure) (HCC)[place]. Diabetes mellitus without complication (HCC)[place]. Dysphagia    esophageal dysphagia  . GERD (gastroesophageal reflux disease)   . Heart murmur   . Hiatal hernia 03/26/2018   EGD  03/26/18 shows gastritis and hiatal hernia 4cm  . Hypertension   . Sickle cell anemia (HCC)    trait    Past Surgical History:  Procedure Laterality Date  . UPPER GASTROINTESTINAL ENDOSCOPY  03/26/2018    Social History   Tobacco Use  . Smoking status: Never Smoker  . Smokeless tobacco: Never Used  Substance Use Topics  . Alcohol use: Not Currently    Family History  Problem Relation Age of Onset  . Hypertension Father   . Diabetes Sister   . Diabetes Brother   . Colon cancer Neg Hx   . Esophageal cancer Neg Hx   . Rectal cancer Neg Hx   . Stomach cancer Neg Hx     Review of Systems  Constitutional: Negative for chills, fever and  malaise/fatigue.  Eyes: Negative for blurred vision and double vision.  Respiratory: Negative for cough, shortness of breath and wheezing.   Cardiovascular: Negative for chest pain, palpitations and leg swelling.  Gastrointestinal: Negative for abdominal pain, blood in stool, constipation, diarrhea, heartburn, nausea and vomiting.  Genitourinary: Negative for dysuria, frequency and hematuria.  Musculoskeletal: Negative for back pain and joint pain.  Skin: Negative for rash.  Neurological: Negative for dizziness, weakness and headaches.     OBJECTIVE:  Today's Vitals   06/14/20 1524  BP: 136/81  Pulse: 79  Temp: 98 F (36.7 C)  SpO2: 96%  Weight: 155 lb (70.3 kg)  Height: '5\' 1"'  (1.549 m)   Body mass index is 29.29 kg/m.   Physical Exam Constitutional:      General: She is not in acute distress.    Appearance: Normal appearance. She is not ill-appearing.  HENT:     Head: Normocephalic.  Cardiovascular:     Rate and Rhythm: Normal rate and regular rhythm.     Pulses: Normal pulses.     Heart sounds: Normal heart sounds. No murmur heard.  No friction rub. No gallop.   Pulmonary:     Effort: Pulmonary effort is normal. No respiratory distress.     Breath sounds: Normal breath sounds. No stridor. No wheezing, rhonchi or rales.  Abdominal:     General: Bowel sounds are normal.     Palpations: Abdomen is soft.     Tenderness: There is no abdominal tenderness.  Musculoskeletal:     Right lower leg: No edema.     Left lower leg: No edema.  Skin:    General: Skin is warm and dry.  Neurological:     Mental Status: She is alert and oriented to person, place, and time.  Psychiatric:        Mood and Affect: Mood normal.        Behavior: Behavior normal.     Results for orders placed or performed in visit on 06/14/20 (from the past 24 hour(s))  POCT glycosylated hemoglobin (Hb A1C)     Status: Abnormal   Collection Time: 06/14/20  4:33 PM  Result Value Ref Range    Hemoglobin A1C 6.5 (A) 4.0 - 5.6 %   HbA1c POC (<> result, manual entry)     HbA1c, POC (prediabetic range)     HbA1c, POC (controlled diabetic range)      No results found.   ASSESSMENT and PLAN  Problem List Items Addressed This Visit      Cardiovascular and Mediastinum   Essential hypertension   Relevant Medications   atorvastatin (LIPITOR) 20 MG tablet   losartan (COZAAR) 50 MG tablet   metoprolol  tartrate (LOPRESSOR) 25 MG tablet Stable on current regimen, at goal < 140/90 BP Readings from Last 3 Encounters:  06/14/20 136/81  11/17/19 132/83  09/10/19 130/80       Other Visit Diagnoses    Screening for colon cancer    -  Will follow up with cologuard and mammogram in January once patient gets medicare.   Encounter for immunization       Relevant Orders   Flu Vaccine QUAD 36+ mos IM (Completed)   Type 2 diabetes mellitus with hyperglycemia, without long-term current use of insulin (HCC)       Relevant Medications   atorvastatin (LIPITOR) 20 MG tablet   losartan (COZAAR) 50 MG tablet   metFORMIN (GLUCOPHAGE) 1000 MG tablet Stable on current regimen, will follow up with lab results.   Other Relevant Orders   CMP14+EGFR   POCT glycosylated hemoglobin (Hb A1C) Lab Results  Component Value Date   HGBA1C 6.5 (A) 06/14/2020      Dyslipidemia associated with type 2 diabetes mellitus (HCC)       Relevant Medications   atorvastatin (LIPITOR) 20 MG tablet   losartan (COZAAR) 50 MG tablet   metFORMIN (GLUCOPHAGE) 1000 MG tablet Stable on current regimen. Will follow up with lab results. No new myalgias.   Other Relevant Orders   Lipid panel   Encounter for hepatitis C screening test for low risk patient     Will follow up with this in Jan once she gets medicare.       Return in about 3 months (around 09/14/2020) for Medication follow up.    Huston Foley Eureka Valdes, FNP-BC Primary Care at [place], [place] [address] Ph.  [phone] Fax  [phone]

## 2020-06-14 NOTE — Patient Instructions (Addendum)
Schedule appointment once have medicare in January for screenings.    If you have lab work done today you will Ibeth contacted with your lab results within the next 2 weeks.  If you have not heard from [place] then please contact us. The fastest way to get your results is to register for My Chart.   IF you received an x-ray today, you will receive an invoice from [facility]. Please contact [facility] at [phone] with questions or concerns regarding your invoice.   IF you received labwork today, you will receive an invoice from [facility]. Please contact [facility] at [phone] with questions or concerns regarding your invoice.   Our billing staff will not Manjot able to assist you with questions regarding bills from these companies.  You will Cniyah contacted with the lab results as soon as they are available. The fastest way to get your results is to activate your My Chart account. Instructions are located on the last page of this paperwork. If you have not heard from [place] regarding the results in 2 weeks, please contact this office.      Health Maintenance After Age 64 After age 25, you are at a higher risk for certain long-term diseases and infections as well as injuries from falls. Falls are a major cause of broken bones and head injuries in people who are older than age 54. Getting regular preventive care can help to keep you healthy and well. Preventive care includes getting regular testing and making lifestyle changes as recommended by your health care provider. Talk with your health care provider about:  Which screenings and tests you should have. A screening is a test that checks for a disease when you have no symptoms.  A diet and exercise plan that is right for you. What should I know about screenings and tests to prevent falls? Screening and testing are the best ways to find a health problem early. Early diagnosis and treatment give you the best chance of managing medical  conditions that are common after age 22. Certain conditions and lifestyle choices may make you more likely to have a fall. Your health care provider may recommend:  Regular vision checks. Poor vision and conditions such as cataracts can make you more likely to have a fall. If you wear glasses, make sure to get your prescription updated if your vision changes.  Medicine review. Work with your health care provider to regularly review all of the medicines you are taking, including over-the-counter medicines. Ask your health care provider about any side effects that may make you more likely to have a fall. Tell your health care provider if any medicines that you take make you feel dizzy or sleepy.  Osteoporosis screening. Osteoporosis is a condition that causes the bones to get weaker. This can make the bones weak and cause them to break more easily.  Blood pressure screening. Blood pressure changes and medicines to control blood pressure can make you feel dizzy.  Strength and balance checks. Your health care provider may recommend certain tests to check your strength and balance while standing, walking, or changing positions.  Foot health exam. Foot pain and numbness, as well as not wearing proper footwear, can make you more likely to have a fall.  Depression screening. You may Ellenora more likely to have a fall if you have a fear of falling, feel emotionally low, or feel unable to do activities that you used to do.  Alcohol use screening. Using too much alcohol can  affect your balance and may make you more likely to have a fall. What actions can I take to lower my risk of falls? General instructions  Talk with your health care provider about your risks for falling. Tell your health care provider if: ? You fall. Jaelynn sure to tell your health care provider about all falls, even ones that seem minor. ? You feel dizzy, sleepy, or off-balance.  Take over-the-counter and prescription medicines only as told  by your health care provider. These include any supplements.  Eat a healthy diet and maintain a healthy weight. A healthy diet includes low-fat dairy products, low-fat (lean) meats, and fiber from whole grains, beans, and lots of fruits and vegetables. Home safety  Remove any tripping hazards, such as rugs, cords, and clutter.  Install safety equipment such as grab bars in bathrooms and safety rails on stairs.  Keep rooms and walkways well-lit. Activity   Follow a regular exercise program to stay fit. This will help you maintain your balance. Ask your health care provider what types of exercise are appropriate for you.  If you need a cane or walker, use it as recommended by your health care provider.  Wear supportive shoes that have nonskid soles. Lifestyle  Do not drink alcohol if your health care provider tells you not to drink.  If you drink alcohol, limit how much you have: ? 0-1 drink a day for women. ? 0-2 drinks a day for men.  Scarleth aware of how much alcohol is in your drink. In the [place] one drink equals one typical bottle of beer (12 oz), one-half glass of wine (5 oz), or one shot of hard liquor (1 oz).  Do not use any products that contain nicotine or tobacco, such as cigarettes and e-cigarettes. If you need help quitting, ask your health care provider. Summary  Having a healthy lifestyle and getting preventive care can help to protect your health and wellness after age 37.  Screening and testing are the best way to find a health problem early and help you avoid having a fall. Early diagnosis and treatment give you the best chance for managing medical conditions that are more common for people who are older than age 72.  Falls are a major cause of broken bones and head injuries in people who are older than age 60. Take precautions to prevent a fall at home.  Work with your health care provider to learn what changes you can make to improve your health and wellness and to  prevent falls. This information is not intended to replace advice given to you by your health care provider. Make sure you discuss any questions you have with your health care provider. Document Revised: 11/21/2018 Document Reviewed: 06/13/2017 Elsevier Patient Education  2020 [employer].

## 2020-06-15 LAB — CMP14+EGFR
ALT: 14 IU/L (ref 0–32)
AST: 14 IU/L (ref 0–40)
Albumin/Globulin Ratio: 1.7 (ref 1.2–2.2)
Albumin: 4.7 g/dL (ref 3.8–4.8)
Alkaline Phosphatase: 103 IU/L (ref 44–121)
BUN/Creatinine Ratio: 19 (ref 12–28)
BUN: 13 mg/dL (ref 8–27)
Bilirubin Total: 0.2 mg/dL (ref 0.0–1.2)
CO2: 22 mmol/L (ref 20–29)
Calcium: 9.5 mg/dL (ref 8.7–10.3)
Chloride: 103 mmol/L (ref 96–106)
Creatinine, Ser: 0.7 mg/dL (ref 0.57–1.00)
GFR calc Af Amer: 106 mL/min/{1.73_m2} (ref >59)
GFR calc non Af Amer: 92 mL/min/{1.73_m2} (ref >59)
Globulin, Total: 2.8 g/dL (ref 1.5–4.5)
Glucose: 68 mg/dL (ref 65–99)
Potassium: 4.9 mmol/L (ref 3.5–5.2)
Sodium: 139 mmol/L (ref 134–144)
Total Protein: 7.5 g/dL (ref 6.0–8.5)

## 2020-06-15 LAB — LIPID PANEL
Chol/HDL Ratio: 5.3 ratio — ABNORMAL HIGH (ref 0.0–4.4)
Cholesterol, Total: 203 mg/dL — ABNORMAL HIGH (ref 100–199)
HDL: 38 mg/dL — ABNORMAL LOW (ref >39)
LDL Chol Calc (NIH): 91 mg/dL (ref 0–99)
Triglycerides: 452 mg/dL — ABNORMAL HIGH (ref 0–149)
VLDL Cholesterol Cal: 74 mg/dL — ABNORMAL HIGH (ref 5–40)

## 2020-06-15 NOTE — Progress Notes (Signed)
Hello, If you could let Madeline Bell know her labs look great! No medication changes needed at this time. Thanks!

## 2020-09-14 ENCOUNTER — Encounter: Payer: Self-pay | Admitting: Family Medicine

## 2020-09-14 ENCOUNTER — Other Ambulatory Visit: Payer: Self-pay

## 2020-09-14 ENCOUNTER — Ambulatory Visit (INDEPENDENT_AMBULATORY_CARE_PROVIDER_SITE_OTHER): Payer: Medicare Other | Admitting: Family Medicine

## 2020-09-14 VITALS — BP 186/82 | HR 83 | Temp 97.9°F | Ht 61.0 in | Wt 156.0 lb

## 2020-09-14 DIAGNOSIS — I471 Supraventricular tachycardia, unspecified: Secondary | ICD-10-CM

## 2020-09-14 DIAGNOSIS — E1169 Type 2 diabetes mellitus with other specified complication: Secondary | ICD-10-CM | POA: Diagnosis not present

## 2020-09-14 DIAGNOSIS — E785 Hyperlipidemia, unspecified: Secondary | ICD-10-CM

## 2020-09-14 DIAGNOSIS — Z1231 Encounter for screening mammogram for malignant neoplasm of breast: Secondary | ICD-10-CM

## 2020-09-14 DIAGNOSIS — R1319 Other dysphagia: Secondary | ICD-10-CM

## 2020-09-14 DIAGNOSIS — I10 Essential (primary) hypertension: Secondary | ICD-10-CM | POA: Diagnosis not present

## 2020-09-14 DIAGNOSIS — Z1211 Encounter for screening for malignant neoplasm of colon: Secondary | ICD-10-CM | POA: Diagnosis not present

## 2020-09-14 DIAGNOSIS — Z1159 Encounter for screening for other viral diseases: Secondary | ICD-10-CM

## 2020-09-14 DIAGNOSIS — E1165 Type 2 diabetes mellitus with hyperglycemia: Secondary | ICD-10-CM

## 2020-09-14 DIAGNOSIS — E2839 Other primary ovarian failure: Secondary | ICD-10-CM

## 2020-09-14 MED ORDER — LOSARTAN POTASSIUM 50 MG PO TABS: 75.0000 mg | ORAL_TABLET | Freq: Every day | ORAL | 3 refills | Status: AC

## 2020-09-14 NOTE — Patient Instructions (Addendum)
Pap Test Why am I having this test? A Pap test, also called a Pap smear, is a screening test to check for signs of:  Cancer of the vagina, cervix, and uterus. The cervix is the lower part of the uterus that opens into the vagina.  Infection.  Changes that may Madeline Bell a sign that cancer is developing (precancerous changes). Women need this test on a regular basis. In general, you should have a Pap test every 3 years until you reach menopause or age 65. Women aged 30-60 may choose to have their Pap test done at the same time as an HPV (human papillomavirus) test every 5 years (instead of every 3 years). Your health care provider may recommend having Pap tests more or less often depending on your medical conditions and past Pap test results. What kind of sample is taken? Your health care provider will collect a sample of cells from the surface of your cervix. This will Madeline Bell done using a small cotton swab, plastic spatula, or brush. This sample is often collected during a pelvic exam, when you are lying on your back on an exam table with feet in footrests (stirrups). In some cases, fluids (secretions) from the cervix or vagina may also Madeline Bell collected.   How do I prepare for this test?  Madeline Bell aware of where you are in your menstrual cycle. If you are menstruating on the day of the test, you may Madeline Bell asked to reschedule.  You may need to reschedule if you have a known vaginal infection on the day of the test.  Follow instructions from your health care provider about: ? Changing or stopping your regular medicines. Some medicines can cause abnormal test results, such as digitalis and tetracycline. ? Avoiding douching or taking a bath the day before or the day of the test. Tell a health care provider about:  Any allergies you have.  All medicines you are taking, including vitamins, herbs, eye drops, creams, and over-the-counter medicines.  Any blood disorders you have.  Any surgeries you have had.  Any  medical conditions you have.  Whether you are pregnant or may Madeline Bell pregnant. How are the results reported? Your test results will Madeline Bell reported as either abnormal or normal. A false-positive result can occur. A false positive is incorrect because it means that a condition is present when it is not. A false-negative result can occur. A false negative is incorrect because it means that a condition is not present when it is. What do the results mean? A normal test result means that you do not have signs of cancer of the vagina, cervix, or uterus. An abnormal result may mean that you have:  Cancer. A Pap test by itself is not enough to diagnose cancer. You will have more tests done in this case.  Precancerous changes in your vagina, cervix, or uterus.  Inflammation of the cervix.  An STD (sexually transmitted disease).  A fungal infection.  A parasite infection. Talk with your health care provider about what your results mean. Questions to ask your health care provider Ask your health care provider, or the department that is doing the test:  When will my results Madeline Bell ready?  How will I get my results?  What are my treatment options?  What other tests do I need?  What are my next steps? Summary  In general, women should have a Pap test every 3 years until they reach menopause or age 29.  Your health care provider will collect  a sample of cells from the surface of your cervix. This will Madeline Bell done using a small cotton swab, plastic spatula, or brush.  In some cases, fluids (secretions) from the cervix or vagina may also Madeline Bell collected. This information is not intended to replace advice given to you by your health care provider. Make sure you discuss any questions you have with your health care provider. Document Revised: 04/07/2020 Document Reviewed: 04/02/2020 Elsevier Patient Education  2021 Else[employer] Inc[employer]   Health Maintenance After Age 91 After age 40, you are at a higher risk for  certain long-term diseases and infections as well as injuries from falls. Falls are a major cause of broken bones and head injuries in people who are older than age 71. Getting regular preventive care can help to keep you healthy and well. Preventive care includes getting regular testing and making lifestyle changes as recommended by your health care provider. Talk with your health care provider about:  Which screenings and tests you should have. A screening is a test that checks for a disease when you have no symptoms.  A diet and exercise plan that is right for you. What should I know about screenings and tests to prevent falls? Screening and testing are the best ways to find a health problem early. Early diagnosis and treatment give you the best chance of managing medical conditions that are common after age 58. Certain conditions and lifestyle choices may make you more likely to have a fall. Your health care provider may recommend:  Regular vision checks. Poor vision and conditions such as cataracts can make you more likely to have a fall. If you wear glasses, make sure to get your prescription updated if your vision changes.  Medicine review. Work with your health care provider to regularly review all of the medicines you are taking, including over-the-counter medicines. Ask your health care provider about any side effects that may make you more likely to have a fall. Tell your health care provider if any medicines that you take make you feel dizzy or sleepy.  Osteoporosis screening. Osteoporosis is a condition that causes the bones to get weaker. This can make the bones weak and cause them to break more easily.  Blood pressure screening. Blood pressure changes and medicines to control blood pressure can make you feel dizzy.  Strength and balance checks. Your health care provider may recommend certain tests to check your strength and balance while standing, walking, or changing  positions.  Foot health exam. Foot pain and numbness, as well as not wearing proper footwear, can make you more likely to have a fall.  Depression screening. You may Madeline Bell more likely to have a fall if you have a fear of falling, feel emotionally low, or feel unable to do activities that you used to do.  Alcohol use screening. Using too much alcohol can affect your balance and may make you more likely to have a fall. What actions can I take to lower my risk of falls? General instructions  Talk with your health care provider about your risks for falling. Tell your health care provider if: ? You fall. Madeline Bell sure to tell your health care provider about all falls, even ones that seem minor. ? You feel dizzy, sleepy, or off-balance.  Take over-the-counter and prescription medicines only as told by your health care provider. These include any supplements.  Eat a healthy diet and maintain a healthy weight. A healthy diet includes low-fat dairy products, low-fat (lean) meats, and  fiber from whole grains, beans, and lots of fruits and vegetables. Home safety  Remove any tripping hazards, such as rugs, cords, and clutter.  Install safety equipment such as grab bars in bathrooms and safety rails on stairs.  Keep rooms and walkways well-lit. Activity  Follow a regular exercise program to stay fit. This will help you maintain your balance. Ask your health care provider what types of exercise are appropriate for you.  If you need a cane or walker, use it as recommended by your health care provider.  Wear supportive shoes that have nonskid soles.   Lifestyle  Do not drink alcohol if your health care provider tells you not to drink.  If you drink alcohol, limit how much you have: ? 0-1 drink a day for women. ? 0-2 drinks a day for men.  Madeline Bell aware of how much alcohol is in your drink. In the [place] one drink equals one typical bottle of beer (12 oz), one-half glass of wine (5 oz), or one shot of hard  liquor (1 oz).  Do not use any products that contain nicotine or tobacco, such as cigarettes and e-cigarettes. If you need help quitting, ask your health care provider. Summary  Having a healthy lifestyle and getting preventive care can help to protect your health and wellness after age 103.  Screening and testing are the best way to find a health problem early and help you avoid having a fall. Early diagnosis and treatment give you the best chance for managing medical conditions that are more common for people who are older than age 40.  Falls are a major cause of broken bones and head injuries in people who are older than age 46. Take precautions to prevent a fall at home.  Work with your health care provider to learn what changes you can make to improve your health and wellness and to prevent falls. This information is not intended to replace advice given to you by your health care provider. Make sure you discuss any questions you have with your health care provider. Document Revised: 11/21/2018 Document Reviewed: 06/13/2017 Elsevier Patient Education  2021 [employer].    If you have lab work done today you will Madeline Bell contacted with your lab results within the next 2 weeks.  If you have not heard from [place] then please contact us. The fastest way to get your results is to register for My Chart.   IF you received an x-ray today, you will receive an invoice from [facility]. Please contact [facility] at [phone] with questions or concerns regarding your invoice.   IF you received labwork today, you will receive an invoice from [facility]. Please contact [facility] at [phone] with questions or concerns regarding your invoice.   Our billing staff will not Madeline Bell able to assist you with questions regarding bills from these companies.  You will Madeline Bell contacted with the lab results as soon as they are available. The fastest way to get your results is to activate your My Chart  account. Instructions are located on the last page of this paperwork. If you have not heard from [place] regarding the results in 2 weeks, please contact this office.

## 2020-09-14 NOTE — Progress Notes (Signed)
2/1/20229:52 AM  Madeline Bell 04-May-1956, 65 y.o., female [id number]  Chief Complaint  Patient presents with  . new medicare patient     Discuss at home colon cancer screen , wants to check on coverage for dexa scan  . Hypertension    Continues taking medications, home readings available    HPI:   Patient is a 65 y.o. female with past medical history significant for HTN, DM, HLD who presents today for medication refill.  Issues with chronic cough Worse when eats solid food Was evaluated in the past by GI and pulmonology Diagnosed with Jerrye Bushy and needed previous esophageal dilation last seen by GI 08/2018 and didn't follow up.    Diabetes Metformin 1032m daily Denies diarrhea, constipation, nausea Atorvastatin 259mdaily Lab Results  Component Value Date   HGBA1C 6.5 (A) 06/14/2020   Lab Results  Component Value Date   CHOL 203 (H) 06/14/2020   HDL 38 (L) 06/14/2020   LDLCALC 91 06/14/2020   TRIG 452 (H) 06/14/2020   CHOLHDL 5.3 (H) 06/14/2020      HTN Metoprolol 2548mosartan 60m46m goal< 140/90 Home readings most days: 120-150/x BP Readings from Last 3 Encounters:  09/14/20 (!) 186/82  06/14/20 136/81  11/17/19 132/83     Health Maintenance  Topic Date Due  . FOOT EXAM  Never done  . OPHTHALMOLOGY EXAM  Never done  . PAP SMEAR-Modifier  Never done  . COLONOSCOPY (Pts 45-61yr75yrurance coverage will need to [place] confirmed)  Never done  . DEXA SCAN  Never done  . PNA vac Low Risk Adult (1 of 2 - PCV13) Never done  . MAMMOGRAM  06/14/2021 (Originally 08/14/2005)  . HEMOGLOBIN A1C  12/12/2020  . TETANUS/TDAP  03/08/2028  . INFLUENZA VACCINE  Completed  . COVID-19 Vaccine  Completed  . Hepatitis C Screening  Completed  . HIV Screening  Completed     Depression screen [facility] 06/14/2020 11/17/2019  Decreased Interest 0 0 0  Down, Depressed, Hopeless 0 0 0  PHQ - 2 Score 0 0 0    Fall Risk  09/14/2020 06/14/2020 11/17/2019 09/10/2019 03/19/2019  Falls  in the past year? 0 0 0 0 0  Number falls in past yr: 0 0 0 0 0  Injury with Fall? 0 0 0 0 0  Comment - - - - -  Follow up _0      Allergies  Allergen Reactions  . Tylenol [Acetaminophen] Rash  . Amlodipine Besylate Rash  . Lisinopril-Hydrochlorothiazide   . Bactrim [Sulfamethoxazole-Trimethoprim]     Rash   . Cephalexin   . Doxycycline     Rash and itching and lips feel burnt, no anaphylaxis  . Gentamicin Sulfate   . Shellfish Allergy   . Nsaids Rash  . Penicillins Rash and Other (See Comments)    Burning   Has patient had a PCN reaction causing immediate rash, facial/tongue/throat swelling, SOB or lightheadedness with hypotension: Yes Has patient had a PCN reaction causing severe rash involving mucus membranes or skin necrosis: No Has patient had a PCN reaction that required hospitalization: No Has patient had a PCN reaction occurring within the last 10 years: No If all of the above answers are "NO", then may proceed with Cephalosporin use.   . Tetracyclines & Related Rash    Prior to Admission medications   Medication Sig Start Date End Date Taking? Authorizing Provider  atorvastatin (LIPITOR) 20 MG tablet Take 1 tablet (20 mg total) by mouth every other day. 06/14/20   Draydon Clairmont, Laurita Quint, FNP  Continuous Blood Gluc Sensor (FREESTYLE LIBRE 14 DAY SENSOR) MISC USE AS DIRECTED EVERY 14 DAYS 06/14/20   Devinn Voshell, Laurita Quint, FNP  loratadine (CLARITIN) 10 MG tablet Take 1 tablet (10 mg total) by mouth daily. 11/24/19   Forrest Moron, MD  losartan (COZAAR) 50 MG tablet Take 1 tablet (50 mg total) by mouth daily. 06/14/20 06/14/21  Denny Mccree, Laurita Quint, FNP  metFORMIN (GLUCOPHAGE) 1000 MG tablet TAKE 1 TABLET BY MOUTH TWICE A DAY WITH MEAL 06/14/20   Makaila Windle, Laurita Quint, FNP  metoprolol tartrate (LOPRESSOR) 25 MG tablet Take 0.5 tablets (12.5 mg total) by mouth 2 (two) times  daily. 06/14/20   Azavion Bouillon, Laurita Quint, FNP    Past Medical History:  Diagnosis Date  . CHF (congestive heart failure) ([place])   . Diabetes mellitus without complication ([place])   . Dysphagia    esophageal dysphagia  . GERD (gastroesophageal reflux disease)   . Heart murmur   . Hiatal hernia 03/26/2018   EGD 03/26/18 shows gastritis and hiatal hernia 4cm  . Hypertension   . Sickle cell anemia (HCC)    trait    Past Surgical History:  Procedure Laterality Date  . UPPER GASTROINTESTINAL ENDOSCOPY  03/26/2018    Social History   Tobacco Use  . Smoking status: Never Smoker  . Smokeless tobacco: Never Used  Substance Use Topics  . Alcohol use: Not Currently    Family History  Problem Relation Age of Onset  . Hypertension Father   . Diabetes Sister   . Diabetes Brother   . Colon cancer Neg Hx   . Esophageal cancer Neg Hx   . Rectal cancer Neg Hx   . Stomach cancer Neg Hx     Review of Systems  Constitutional: Negative for chills, fever and malaise/fatigue.  Eyes: Negative for blurred vision and double vision.  Respiratory: Positive for cough. Negative for sputum production, shortness of breath and wheezing.   Cardiovascular: Negative for chest pain, palpitations and leg swelling.  Gastrointestinal: Negative for abdominal pain, blood in stool, constipation, diarrhea, heartburn, nausea and vomiting.  Genitourinary: Negative for dysuria, frequency and hematuria.  Musculoskeletal: Negative for back pain and joint pain.  Skin: Negative for rash.  Neurological: Negative for dizziness, weakness and headaches.     OBJECTIVE:  Today's Vitals   09/14/20 0810  BP: (!) 186/82  Pulse: 83  Temp: 97.9 F (36.6 C)  SpO2: 96%  Weight: 156 lb (70.8 kg)  Height: _0  (1.549 m)   Body mass index is 29.48 kg/m.   Physical Exam Constitutional:      General: She is not in acute distress.    Appearance: Normal appearance. She is not ill-appearing.  HENT:     Head: Normocephalic.      Mouth/Throat:     Mouth: Mucous membranes are moist.     Pharynx: Oropharynx is clear. No oropharyngeal exudate or posterior oropharyngeal erythema.  Cardiovascular:     Rate and Rhythm: Normal rate and regular rhythm.     Pulses: Normal pulses.     Heart sounds: Normal heart sounds. No murmur heard. No friction rub. No gallop.   Pulmonary:     Effort: Pulmonary effort is normal. No respiratory distress.     Breath sounds: Normal breath sounds. No stridor. No wheezing, rhonchi or rales.  Abdominal:     General: Bowel  sounds are normal.     Palpations: Abdomen is soft.     Tenderness: There is no abdominal tenderness.  Musculoskeletal:     Right lower leg: No edema.     Left lower leg: No edema.  Skin:    General: Skin is warm and dry.  Neurological:     Mental Status: She is alert and oriented to person, place, and time.  Psychiatric:        Mood and Affect: Mood normal.        Behavior: Behavior normal.     No results found for this or any previous visit (from the past 24 hour(s)).  No results found.   ASSESSMENT and PLAN  Problem List Items Addressed This Visit      Cardiovascular and Mediastinum   Essential hypertension   Relevant Medications   losartan (COZAAR) 50 MG tablet   Other Relevant Orders   CMP14+EGFR   Paroxysmal SVT (supraventricular tachycardia) (HCC)   Relevant Medications   losartan (COZAAR) 50 MG tablet     Digestive   Esophageal dysphagia   Relevant Orders   Ambulatory referral to Gastroenterology     Endocrine   Dyslipidemia associated with type 2 diabetes mellitus ([place]) - Primary   Relevant Medications   losartan (COZAAR) 50 MG tablet   Other Relevant Orders   Lipid Panel    Other Visit Diagnoses    Screening for colon cancer       Relevant Orders   Cologuard   Estrogen deficiency       Relevant Orders   DG Bone Density   Encounter for screening mammogram for malignant neoplasm of breast       Relevant Orders   MS DIGITAL  SCREENING TOMO BILATERAL   Type 2 diabetes mellitus with hyperglycemia, without long-term current use of insulin (HCC)       Relevant Medications   losartan (COZAAR) 50 MG tablet   Other Relevant Orders   Hemoglobin A1c   Encounter for hepatitis C screening test for low risk patient       Relevant Orders   Hepatitis C antibody      Plan . Schedule pap when able . Will follow up with all labs . Dexa, mammogram, and cologuard ordered . Increase losartan from 50 to 75 per home readings, continue metoprolol . Will follow up with HLD and atorvastatin post labs  Will follow up with DM and metformin post labs  Encouraged to follow up with GI for dsyphagia (last seen 08/2018 and didn't follow up)  Return in about 3 months (around 12/12/2020).    Huston Foley Shantele Reller, FNP-BC [facility] [place], [place] [address] Ph.  [phone] Fax [phone]

## 2020-09-15 LAB — CMP14+EGFR
ALT: 22 IU/L (ref 0–32)
AST: 21 IU/L (ref 0–40)
Albumin/Globulin Ratio: 1.6 (ref 1.2–2.2)
Albumin: 4.7 g/dL (ref 3.8–4.8)
Alkaline Phosphatase: 90 IU/L (ref 44–121)
BUN/Creatinine Ratio: 17 (ref 12–28)
BUN: 13 mg/dL (ref 8–27)
Bilirubin Total: 0.4 mg/dL (ref 0.0–1.2)
CO2: 21 mmol/L (ref 20–29)
Calcium: 9.4 mg/dL (ref 8.7–10.3)
Chloride: 101 mmol/L (ref 96–106)
Creatinine, Ser: 0.76 mg/dL (ref 0.57–1.00)
GFR calc Af Amer: 95 mL/min/{1.73_m2} (ref >59)
GFR calc non Af Amer: 83 mL/min/{1.73_m2} (ref >59)
Globulin, Total: 2.9 g/dL (ref 1.5–4.5)
Glucose: 177 mg/dL — ABNORMAL HIGH (ref 65–99)
Potassium: 4.3 mmol/L (ref 3.5–5.2)
Sodium: 137 mmol/L (ref 134–144)
Total Protein: 7.6 g/dL (ref 6.0–8.5)

## 2020-09-15 LAB — LIPID PANEL
Chol/HDL Ratio: 5.1 ratio — ABNORMAL HIGH (ref 0.0–4.4)
Cholesterol, Total: 187 mg/dL (ref 100–199)
HDL: 37 mg/dL — ABNORMAL LOW (ref >39)
LDL Chol Calc (NIH): 99 mg/dL (ref 0–99)
Triglycerides: 300 mg/dL — ABNORMAL HIGH (ref 0–149)
VLDL Cholesterol Cal: 51 mg/dL — ABNORMAL HIGH (ref 5–40)

## 2020-09-15 LAB — HEPATITIS C ANTIBODY: Hep C Virus Ab: <0.1 s/co ratio (ref 0.0–0.9)

## 2020-09-15 LAB — HEMOGLOBIN A1C
Est. average glucose Bld gHb Est-mCnc: 137 mg/dL
Hgb A1c MFr Bld: 6.4 % — ABNORMAL HIGH (ref 4.8–5.6)

## 2020-09-15 NOTE — Progress Notes (Signed)
Cholesterol and A1c are stable. No changes needed to medications.

## 2020-11-03 ENCOUNTER — Inpatient Hospital Stay: Admission: RE | Admit: 2020-11-03 | Payer: Self-pay | Source: Ambulatory Visit

## 2020-12-14 ENCOUNTER — Ambulatory Visit: Payer: Medicare Other | Admitting: Family Medicine

## 2021-01-27 ENCOUNTER — Other Ambulatory Visit: Payer: Self-pay

## 2023-04-05 DIAGNOSIS — U071 COVID-19: Secondary | ICD-10-CM | POA: Diagnosis not present

## 2023-04-05 DIAGNOSIS — I10 Essential (primary) hypertension: Secondary | ICD-10-CM | POA: Diagnosis not present

## 2023-04-05 DIAGNOSIS — R509 Fever, unspecified: Secondary | ICD-10-CM | POA: Diagnosis not present

## 2023-04-05 DIAGNOSIS — Z6828 Body mass index (BMI) 28.0-28.9, adult: Secondary | ICD-10-CM | POA: Diagnosis not present

## 2023-04-05 DIAGNOSIS — R053 Chronic cough: Secondary | ICD-10-CM | POA: Diagnosis not present

## 2023-04-05 DIAGNOSIS — J302 Other seasonal allergic rhinitis: Secondary | ICD-10-CM | POA: Diagnosis not present

## 2023-04-05 DIAGNOSIS — E1165 Type 2 diabetes mellitus with hyperglycemia: Secondary | ICD-10-CM | POA: Diagnosis not present

## 2023-04-05 DIAGNOSIS — E782 Mixed hyperlipidemia: Secondary | ICD-10-CM | POA: Diagnosis not present

## 2023-08-16 DIAGNOSIS — R053 Chronic cough: Secondary | ICD-10-CM | POA: Diagnosis not present

## 2023-08-16 DIAGNOSIS — I10 Essential (primary) hypertension: Secondary | ICD-10-CM | POA: Diagnosis not present

## 2023-08-16 DIAGNOSIS — E782 Mixed hyperlipidemia: Secondary | ICD-10-CM | POA: Diagnosis not present

## 2023-08-16 DIAGNOSIS — Z6828 Body mass index (BMI) 28.0-28.9, adult: Secondary | ICD-10-CM | POA: Diagnosis not present

## 2023-08-16 DIAGNOSIS — E1165 Type 2 diabetes mellitus with hyperglycemia: Secondary | ICD-10-CM | POA: Diagnosis not present

## 2023-11-01 DIAGNOSIS — R053 Chronic cough: Secondary | ICD-10-CM | POA: Diagnosis not present

## 2023-11-01 DIAGNOSIS — R300 Dysuria: Secondary | ICD-10-CM | POA: Diagnosis not present

## 2023-11-01 DIAGNOSIS — E1165 Type 2 diabetes mellitus with hyperglycemia: Secondary | ICD-10-CM | POA: Diagnosis not present

## 2023-11-01 DIAGNOSIS — Z0000 Encounter for general adult medical examination without abnormal findings: Secondary | ICD-10-CM | POA: Diagnosis not present

## 2023-11-01 DIAGNOSIS — Z6827 Body mass index (BMI) 27.0-27.9, adult: Secondary | ICD-10-CM | POA: Diagnosis not present

## 2023-11-01 DIAGNOSIS — Z1211 Encounter for screening for malignant neoplasm of colon: Secondary | ICD-10-CM | POA: Diagnosis not present

## 2023-11-01 DIAGNOSIS — E782 Mixed hyperlipidemia: Secondary | ICD-10-CM | POA: Diagnosis not present

## 2023-11-01 DIAGNOSIS — I10 Essential (primary) hypertension: Secondary | ICD-10-CM | POA: Diagnosis not present

## 2024-09-18 ENCOUNTER — Encounter: Payer: Self-pay | Admitting: *Deleted

## 2024-09-18 NOTE — Progress Notes (Signed)
 Trena Dunavan                                          MRN: [id number]   09/18/2024   The [facility] Quality Team Specialist reviewed this patient medical record for the purposes of chart review for care gap closure. The following were reviewed: chart review for care gap closure-diabetic eye exam.    [facility] Quality Team
